# Patient Record
Sex: Female | Born: 1965 | Race: White | Hispanic: No | Marital: Single | Smoking: Current every day smoker
Health system: Southern US, Community
[De-identification: ages and names within clinical notes are randomized; demographics above are authoritative.]

## PROBLEM LIST (undated history)

## (undated) DIAGNOSIS — I10 Essential (primary) hypertension: Secondary | ICD-10-CM

## (undated) DIAGNOSIS — K219 Gastro-esophageal reflux disease without esophagitis: Secondary | ICD-10-CM

## (undated) DIAGNOSIS — J45909 Unspecified asthma, uncomplicated: Secondary | ICD-10-CM

## (undated) DIAGNOSIS — J449 Chronic obstructive pulmonary disease, unspecified: Secondary | ICD-10-CM

## (undated) DIAGNOSIS — IMO0001 Reserved for inherently not codable concepts without codable children: Secondary | ICD-10-CM

## (undated) DIAGNOSIS — K7460 Unspecified cirrhosis of liver: Secondary | ICD-10-CM

## (undated) DIAGNOSIS — R011 Cardiac murmur, unspecified: Secondary | ICD-10-CM

## (undated) HISTORY — PX: TUBAL LIGATION: SHX77

## (undated) HISTORY — PX: SHOULDER SURGERY: SHX246

## (undated) HISTORY — PX: NECK SURGERY: SHX720

## (undated) HISTORY — PX: ANKLE SURGERY: SHX546

---

## 2001-02-03 ENCOUNTER — Inpatient Hospital Stay (HOSPITAL_COMMUNITY): Admission: EM | Admit: 2001-02-03 | Discharge: 2001-02-06 | Payer: Self-pay | Admitting: Psychiatry

## 2003-06-13 ENCOUNTER — Emergency Department (HOSPITAL_COMMUNITY): Admission: EM | Admit: 2003-06-13 | Discharge: 2003-06-13 | Payer: Self-pay | Admitting: Emergency Medicine

## 2003-06-13 ENCOUNTER — Emergency Department (HOSPITAL_COMMUNITY): Admission: EM | Admit: 2003-06-13 | Discharge: 2003-06-14 | Payer: Self-pay | Admitting: Emergency Medicine

## 2003-06-14 ENCOUNTER — Encounter: Payer: Self-pay | Admitting: Emergency Medicine

## 2003-07-02 ENCOUNTER — Emergency Department (HOSPITAL_COMMUNITY): Admission: EM | Admit: 2003-07-02 | Discharge: 2003-07-02 | Payer: Self-pay | Admitting: Emergency Medicine

## 2003-07-03 ENCOUNTER — Inpatient Hospital Stay (HOSPITAL_COMMUNITY): Admission: AD | Admit: 2003-07-03 | Discharge: 2003-07-06 | Payer: Self-pay | Admitting: Psychiatry

## 2016-01-04 ENCOUNTER — Emergency Department (HOSPITAL_COMMUNITY): Payer: BLUE CROSS/BLUE SHIELD

## 2016-01-04 ENCOUNTER — Emergency Department (HOSPITAL_COMMUNITY)
Admission: EM | Admit: 2016-01-04 | Discharge: 2016-01-04 | Disposition: A | Discharge status: Home / Self Care | Payer: BLUE CROSS/BLUE SHIELD | Attending: Emergency Medicine | Admitting: Emergency Medicine

## 2016-01-04 ENCOUNTER — Encounter (HOSPITAL_COMMUNITY): Payer: Self-pay | Admitting: *Deleted

## 2016-01-04 DIAGNOSIS — J449 Chronic obstructive pulmonary disease, unspecified: Secondary | ICD-10-CM | POA: Diagnosis not present

## 2016-01-04 DIAGNOSIS — Y9289 Other specified places as the place of occurrence of the external cause: Secondary | ICD-10-CM | POA: Insufficient documentation

## 2016-01-04 DIAGNOSIS — S4992XA Unspecified injury of left shoulder and upper arm, initial encounter: Secondary | ICD-10-CM | POA: Diagnosis present

## 2016-01-04 DIAGNOSIS — Y9389 Activity, other specified: Secondary | ICD-10-CM | POA: Insufficient documentation

## 2016-01-04 DIAGNOSIS — I10 Essential (primary) hypertension: Secondary | ICD-10-CM | POA: Insufficient documentation

## 2016-01-04 DIAGNOSIS — S42212A Unspecified displaced fracture of surgical neck of left humerus, initial encounter for closed fracture: Secondary | ICD-10-CM | POA: Insufficient documentation

## 2016-01-04 DIAGNOSIS — S42302A Unspecified fracture of shaft of humerus, left arm, initial encounter for closed fracture: Secondary | ICD-10-CM

## 2016-01-04 DIAGNOSIS — F17200 Nicotine dependence, unspecified, uncomplicated: Secondary | ICD-10-CM | POA: Insufficient documentation

## 2016-01-04 DIAGNOSIS — Y998 Other external cause status: Secondary | ICD-10-CM | POA: Diagnosis not present

## 2016-01-04 DIAGNOSIS — Z79899 Other long term (current) drug therapy: Secondary | ICD-10-CM | POA: Diagnosis not present

## 2016-01-04 DIAGNOSIS — W000XXA Fall on same level due to ice and snow, initial encounter: Secondary | ICD-10-CM | POA: Diagnosis not present

## 2016-01-04 HISTORY — DX: Essential (primary) hypertension: I10

## 2016-01-04 HISTORY — DX: Unspecified asthma, uncomplicated: J45.909

## 2016-01-04 HISTORY — DX: Chronic obstructive pulmonary disease, unspecified: J44.9

## 2016-01-04 LAB — CBC WITH DIFFERENTIAL/PLATELET
Basophils Absolute: 0 10*3/uL (ref 0.0–0.1)
Basophils Relative: 0 %
EOS ABS: 0.1 10*3/uL (ref 0.0–0.7)
EOS PCT: 1 %
HCT: 39.8 % (ref 36.0–46.0)
HEMOGLOBIN: 13.1 g/dL (ref 12.0–15.0)
LYMPHS ABS: 1.8 10*3/uL (ref 0.7–4.0)
Lymphocytes Relative: 17 %
MCH: 31.8 pg (ref 26.0–34.0)
MCHC: 32.9 g/dL (ref 30.0–36.0)
MCV: 96.6 fL (ref 78.0–100.0)
MONO ABS: 1.2 10*3/uL — AB (ref 0.1–1.0)
MONOS PCT: 11 %
Neutro Abs: 7.4 10*3/uL (ref 1.7–7.7)
Neutrophils Relative %: 71 %
PLATELETS: 275 10*3/uL (ref 150–400)
RBC: 4.12 MIL/uL (ref 3.87–5.11)
RDW: 15.2 % (ref 11.5–15.5)
WBC: 10.5 10*3/uL (ref 4.0–10.5)

## 2016-01-04 LAB — BASIC METABOLIC PANEL
Anion gap: 14 (ref 5–15)
BUN: 10 mg/dL (ref 6–20)
CHLORIDE: 102 mmol/L (ref 101–111)
CO2: 23 mmol/L (ref 22–32)
CREATININE: 0.76 mg/dL (ref 0.44–1.00)
Calcium: 8.7 mg/dL — ABNORMAL LOW (ref 8.9–10.3)
GFR calc Af Amer: >60 mL/min (ref >60)
GFR calc non Af Amer: >60 mL/min (ref >60)
GLUCOSE: 108 mg/dL — AB (ref 65–99)
Potassium: 3.8 mmol/L (ref 3.5–5.1)
Sodium: 139 mmol/L (ref 135–145)

## 2016-01-04 MED ORDER — HYDROMORPHONE HCL 1 MG/ML IJ SOLN
1.0000 mg | Freq: Once | INTRAMUSCULAR | Status: AC
  Administered 2016-01-04: 1 mg via INTRAVENOUS
  Filled 2016-01-04: qty 1

## 2016-01-04 MED ORDER — OXYCODONE-ACETAMINOPHEN 5-325 MG PO TABS
1.0000 | ORAL_TABLET | Freq: Once | ORAL | Status: AC
Start: 2016-01-04 — End: 2016-01-04
  Administered 2016-01-04: 1 via ORAL
  Filled 2016-01-04: qty 1

## 2016-01-04 MED ORDER — OXYCODONE-ACETAMINOPHEN 5-325 MG PO TABS: 1.0000 | ORAL_TABLET | Freq: Four times a day (QID) | ORAL | Status: DC | PRN

## 2016-01-04 MED ORDER — FENTANYL CITRATE (PF) 100 MCG/2ML IJ SOLN
50.0000 ug | Freq: Once | INTRAMUSCULAR | Status: AC
  Administered 2016-01-04: 50 ug via INTRAVENOUS
  Filled 2016-01-04: qty 2

## 2016-01-04 MED ORDER — OXYCODONE-ACETAMINOPHEN 5-325 MG PO TABS
1.0000 | ORAL_TABLET | Freq: Once | ORAL | Status: AC
  Administered 2016-01-04: 1 via ORAL
  Filled 2016-01-04: qty 1

## 2016-01-04 MED ORDER — ONDANSETRON 4 MG PO TBDP
4.0000 mg | ORAL_TABLET | Freq: Once | ORAL | Status: AC
  Administered 2016-01-04: 4 mg via ORAL
  Filled 2016-01-04: qty 1

## 2016-01-04 NOTE — ED Notes (Signed)
Pt shirt cut off per pt request, gown placed on pt.

## 2016-01-04 NOTE — Discharge Instructions (Signed)
You were seen in the emergency room today for evaluation following a fall on ice. Your x-rays show evidence of a fracture at the head (the top) of your humerus, or your arm bone. You will need surgery by the orthopedic surgeons. I spoke to Dr. Charlann Boxerlin, the on-call surgeon today, and he would like you to go [facility] today after 1:00 PM to see one of his colleagues for consultation. They are planning to do the surgical repair later this week. In the meantime I will give you a sling for your arm and a prescription for pain medicine. You may keep ice on the area as well to help with pain and swelling.

## 2016-01-04 NOTE — ED Notes (Signed)
Pt presents via POV c/o left shoulder pain after slipping on ice, denies hitting her head.  Pt a x 4, no deformity noted, +pulse.

## 2016-01-04 NOTE — ED Provider Notes (Signed)
CSN: [id number]     Arrival date & time 01/04/16  0707 History   First MD Initiated Contact with Patient 01/04/16 [phone]     Chief Complaint  Patient presents with  . Shoulder Pain    Brenda [name] [address]  [place] [address] [phone] (M)  HPI  Brenda [name] is an 50 y.o. female w/ history of HTN and COPD who presents for evaluation of left shoulder and upper arm pain following a mechanical fall last night when Brenda [name] slipped on some ice in a parking lot. Brenda [name] states Brenda [name] slipped on some ice and fell hitting her left arm/shoulder. Brenda [name] denies hitting her head or LOC. Denies any other injury. Denies new weakness, numbness, or tingling. Brenda [name] states her left shoulder/arm is just very painful. Brenda [name] states Brenda [name] has been visiting her father her in the hospital overnight and has not been able to take anything for the pain.   Past Medical History  Diagnosis Date  . Hypertension   . COPD (chronic obstructive pulmonary disease) (HCC)   . Asthma    Past Surgical History  Procedure Laterality Date  . Tubal ligation    . Shoulder surgery Right   . Neck surgery    . Ankle surgery Left    No family history on file. Social History  Substance Use Topics  . Smoking status: Current Every Day Smoker  . Smokeless tobacco: None  . Alcohol Use: Yes     Comment: 5-6 "drinks" a week   OB History    No data available     Review of Systems  All other systems reviewed and are negative.      Allergies  Review of patient's allergies indicates no known allergies.  Home Medications   Prior to Admission medications   Medication Sig Start Date End Date Taking? Authorizing Provider  cholecalciferol (VITAMIN D) 1000 units tablet Take 1,000 Units by mouth daily.   Yes Historical Provider, MD  HYDROcodone-acetaminophen (NORCO) 7.5-325 MG tablet Take 1 tablet by mouth every 6 (six) hours as needed for moderate pain.   Yes Historical Provider, MD   montelukast (SINGULAIR) 10 MG tablet Take 10 mg by mouth at bedtime.   Yes Historical Provider, MD  omeprazole (PRILOSEC) 40 MG capsule Take 40 mg by mouth daily.   Yes Historical Provider, MD  potassium chloride 20 MEQ/15ML (10%) SOLN Take 20 mEq by mouth 2 (two) times daily.   Yes Historical Provider, MD  propranolol (INDERAL) 40 MG tablet Take 40 mg by mouth daily.   Yes Historical Provider, MD  traZODone (DESYREL) 150 MG tablet Take 150 mg by mouth at bedtime.   Yes Historical Provider, MD   BP 114/77 mmHg  Pulse 88  Temp(Src) 97.9 F (36.6 C) (Oral)  Resp 20  Ht 5\' 6"  (1.676 m)  Wt 85.73 kg  BMI 30.52 kg/m2  SpO2 95% Physical Exam  Constitutional: Brenda [name] is oriented to person, place, and time.  HENT:  Head: Atraumatic.  Right Ear: Tympanic membrane and external ear normal. No hemotympanum.  Left Ear: Tympanic membrane and external ear normal. No hemotympanum.  Nose: Nose normal.  Mouth/Throat: Oropharynx is clear and moist. No oropharyngeal exudate.  Eyes: Conjunctivae and EOM are normal. Pupils are equal, round, and reactive to light.  Neck: Normal range of motion. Neck supple. No tracheal deviation present.  Cardiovascular: Normal rate, regular rhythm, normal heart sounds and intact distal pulses.   Pulmonary/Chest: Effort normal and breath sounds  normal. No respiratory distress. Brenda [name] has no wheezes. Brenda [name] exhibits no tenderness.  Abdominal: Soft. Bowel sounds are normal. Brenda [name] exhibits no distension. There is no tenderness.  Musculoskeletal: Brenda [name] exhibits no edema.  Left shoulder and upper half of left upper arm is diffusely ttp. Brenda [name] guarding against ROM due to pain. Intact sensation. 5/5 Grip strength bilaterally. No c-spine, t-spine, or l-spine tenderness. No other visible deformity or injury.   Neurological: Brenda [name] is alert and oriented to person, place, and time. No cranial nerve deficit.  Skin: Skin is warm and dry. No abrasion, no bruising and no laceration noted.  Psychiatric: Brenda [name]  has a normal mood and affect.  Nursing note and vitals reviewed.  Filed Vitals:   01/04/16 0730 01/04/16 0804 01/04/16 0815 01/04/16 0830  BP: 125/80  119/80 114/77  Pulse: 84 88 88 88  Temp:      TempSrc:      Resp:      Height:      Weight:      SpO2: 97% 97% 95% 95%     ED Course  Procedures (including critical care time) Labs Review Labs Reviewed  BASIC METABOLIC PANEL - Abnormal; Notable for the following:    Glucose, Bld 108 (*)    Calcium 8.7 (*)    All other components within normal limits  CBC WITH DIFFERENTIAL/PLATELET - Abnormal; Notable for the following:    Monocytes Absolute 1.2 (*)    All other components within normal limits    Imaging Review Dg Shoulder Left  01/04/2016  CLINICAL DATA:  50 year old female with proximal left humerus pain after falling last night on the ice EXAM: LEFT SHOULDER - 2+ VIEW COMPARISON:  Concurrently obtained radiographs of the left humerus FINDINGS: Comminuted and displaced fracture through the surgical neck of the humerus. The humeral head remains located with respect to the glenoid. The visualized thorax is unremarkable. The acromioclavicular joint remains intact. IMPRESSION: 1. Comminuted fracture through the surgical neck of the humerus. 2. The humeral head remains located with respect to the glenoid. Electronically Signed   By: Malachy Moan M.D.   On: 01/04/2016 07:53   Dg Humerus Left  01/04/2016  CLINICAL DATA:  Fall, shoulder pain, injury EXAM: LEFT HUMERUS - 2+ VIEW COMPARISON:  01/04/2016 FINDINGS: There is an acute displaced and comminuted fracture of the left proximal humerus surgical neck. Distal fragment is displaced anteriorly. Humeral head fragment is rotated laterally. Distal aspect of the humerus appears intact. IMPRESSION: Acute displaced left proximal humerus surgical neck fracture. Electronically Signed   By: Judie Petit.  Shick M.D.   On: 01/04/2016 07:54   I have personally reviewed and evaluated these images and lab  results as part of my medical decision-making.   EKG Interpretation None      MDM   Final diagnoses:  Left humeral fracture, closed, initial encounter    Brenda [name] is an 50 y.o. female presenting for eval of left shoulder/arm pain following fall due to slipping on ice. Will obtain shoulder and humerus XR. Will give pain meds.   XR significant for displaced, comminuted fracture through humeral neck. Humeral head fragment is rotated laterally. I ordered basic labs in anticipation of possible surgery. Spoke to Dr. Charlann Boxer. Brenda [name] to be discharged from the ED with shoulder sling and pain meds, go to clinic after 1PM today for outpatient consult. Plan for surgery later this week. I spoke to Brenda [name] and Brenda [name] is in agreement with plan.    Carlene Coria, PA-C 01/04/16 [phone]  Rolan BuccoMelanie Belfi, MD 01/04/16 [phone]

## 2016-01-04 NOTE — ED Notes (Signed)
PA at bedside.

## 2016-01-05 ENCOUNTER — Encounter (HOSPITAL_COMMUNITY): Payer: Self-pay | Admitting: *Deleted

## 2016-01-05 NOTE — Progress Notes (Signed)
[facility] AT [facility] REC[facility] FOR A COPY OF ECHO, EKG, OV. [phone]

## 2016-01-06 ENCOUNTER — Encounter (HOSPITAL_COMMUNITY)
Admission: RE | Disposition: A | Discharge status: Home / Self Care | Payer: Self-pay | Source: Ambulatory Visit | Attending: Orthopedic Surgery

## 2016-01-06 ENCOUNTER — Observation Stay (HOSPITAL_COMMUNITY)
Admission: RE | Admit: 2016-01-06 | Discharge: 2016-01-08 | Disposition: A | Discharge status: Home / Self Care | Payer: BLUE CROSS/BLUE SHIELD | Source: Ambulatory Visit | Attending: Orthopedic Surgery | Admitting: Orthopedic Surgery

## 2016-01-06 ENCOUNTER — Ambulatory Visit (HOSPITAL_COMMUNITY): Payer: BLUE CROSS/BLUE SHIELD

## 2016-01-06 ENCOUNTER — Encounter (HOSPITAL_COMMUNITY): Payer: Self-pay | Admitting: *Deleted

## 2016-01-06 ENCOUNTER — Ambulatory Visit (HOSPITAL_COMMUNITY): Payer: BLUE CROSS/BLUE SHIELD | Admitting: Anesthesiology

## 2016-01-06 DIAGNOSIS — Z419 Encounter for procedure for purposes other than remedying health state, unspecified: Secondary | ICD-10-CM

## 2016-01-06 DIAGNOSIS — J45909 Unspecified asthma, uncomplicated: Secondary | ICD-10-CM | POA: Diagnosis not present

## 2016-01-06 DIAGNOSIS — J449 Chronic obstructive pulmonary disease, unspecified: Secondary | ICD-10-CM | POA: Diagnosis not present

## 2016-01-06 DIAGNOSIS — W000XXA Fall on same level due to ice and snow, initial encounter: Secondary | ICD-10-CM | POA: Diagnosis not present

## 2016-01-06 DIAGNOSIS — S42209A Unspecified fracture of upper end of unspecified humerus, initial encounter for closed fracture: Secondary | ICD-10-CM | POA: Diagnosis present

## 2016-01-06 DIAGNOSIS — F17200 Nicotine dependence, unspecified, uncomplicated: Secondary | ICD-10-CM | POA: Diagnosis not present

## 2016-01-06 DIAGNOSIS — K219 Gastro-esophageal reflux disease without esophagitis: Secondary | ICD-10-CM | POA: Insufficient documentation

## 2016-01-06 DIAGNOSIS — S42222A 2-part displaced fracture of surgical neck of left humerus, initial encounter for closed fracture: Secondary | ICD-10-CM | POA: Diagnosis present

## 2016-01-06 DIAGNOSIS — I10 Essential (primary) hypertension: Secondary | ICD-10-CM | POA: Insufficient documentation

## 2016-01-06 DIAGNOSIS — Z79899 Other long term (current) drug therapy: Secondary | ICD-10-CM | POA: Diagnosis not present

## 2016-01-06 HISTORY — DX: Gastro-esophageal reflux disease without esophagitis: K21.9

## 2016-01-06 HISTORY — PX: ORIF HUMERUS FRACTURE: SHX2126

## 2016-01-06 LAB — COMPREHENSIVE METABOLIC PANEL
ALBUMIN: 3.4 g/dL — AB (ref 3.5–5.0)
ALT: 16 U/L (ref 14–54)
ANION GAP: 8 (ref 5–15)
AST: 17 U/L (ref 15–41)
Alkaline Phosphatase: 70 U/L (ref 38–126)
BILIRUBIN TOTAL: 1.4 mg/dL — AB (ref 0.3–1.2)
BUN: 8 mg/dL (ref 6–20)
CHLORIDE: 101 mmol/L (ref 101–111)
CO2: 27 mmol/L (ref 22–32)
CREATININE: 0.8 mg/dL (ref 0.44–1.00)
Calcium: 8.7 mg/dL — ABNORMAL LOW (ref 8.9–10.3)
GFR calc non Af Amer: >60 mL/min (ref >60)
Glucose, Bld: 109 mg/dL — ABNORMAL HIGH (ref 65–99)
Potassium: 3.6 mmol/L (ref 3.5–5.1)
SODIUM: 136 mmol/L (ref 135–145)
Total Protein: 6.5 g/dL (ref 6.5–8.1)

## 2016-01-06 LAB — CBC WITH DIFFERENTIAL/PLATELET
BASOS PCT: 0 %
Basophils Absolute: 0 10*3/uL (ref 0.0–0.1)
EOS ABS: 0.1 10*3/uL (ref 0.0–0.7)
Eosinophils Relative: 1 %
HEMATOCRIT: 36.2 % (ref 36.0–46.0)
Hemoglobin: 12.4 g/dL (ref 12.0–15.0)
Lymphocytes Relative: 20 %
Lymphs Abs: 2.1 10*3/uL (ref 0.7–4.0)
MCH: 33.2 pg (ref 26.0–34.0)
MCHC: 34.3 g/dL (ref 30.0–36.0)
MCV: 96.8 fL (ref 78.0–100.0)
MONO ABS: 1.4 10*3/uL — AB (ref 0.1–1.0)
MONOS PCT: 13 %
Neutro Abs: 6.7 10*3/uL (ref 1.7–7.7)
Neutrophils Relative %: 66 %
Platelets: 264 10*3/uL (ref 150–400)
RBC: 3.74 MIL/uL — ABNORMAL LOW (ref 3.87–5.11)
RDW: 14.8 % (ref 11.5–15.5)
WBC: 10.3 10*3/uL (ref 4.0–10.5)

## 2016-01-06 LAB — PROTIME-INR
INR: 0.96 (ref 0.00–1.49)
Prothrombin Time: 13 seconds (ref 11.6–15.2)

## 2016-01-06 LAB — APTT: APTT: 33 s (ref 24–37)

## 2016-01-06 SURGERY — OPEN REDUCTION INTERNAL FIXATION (ORIF) PROXIMAL HUMERUS FRACTURE
Anesthesia: Regional | Laterality: Left

## 2016-01-06 MED ORDER — PROPOFOL 10 MG/ML IV BOLUS
INTRAVENOUS | Status: DC | PRN
  Administered 2016-01-06: 150 mg via INTRAVENOUS

## 2016-01-06 MED ORDER — DIPHENHYDRAMINE HCL 12.5 MG/5ML PO ELIX: 12.5000 mg | ORAL_SOLUTION | ORAL | Status: DC | PRN

## 2016-01-06 MED ORDER — FENTANYL CITRATE (PF) 250 MCG/5ML IJ SOLN
INTRAMUSCULAR | Status: DC | PRN
  Administered 2016-01-06: 50 ug via INTRAVENOUS

## 2016-01-06 MED ORDER — HYDROMORPHONE HCL 1 MG/ML IJ SOLN
0.2500 mg | INTRAMUSCULAR | Status: DC | PRN
  Administered 2016-01-06 (×2): 0.5 mg via INTRAVENOUS

## 2016-01-06 MED ORDER — MAGNESIUM CITRATE PO SOLN: 1.0000 | Freq: Once | ORAL | Status: DC | PRN

## 2016-01-06 MED ORDER — BISACODYL 5 MG PO TBEC: 5.0000 mg | DELAYED_RELEASE_TABLET | Freq: Every day | ORAL | Status: DC | PRN

## 2016-01-06 MED ORDER — POTASSIUM CHLORIDE 20 MEQ/15ML (10%) PO SOLN
20.0000 meq | Freq: Two times a day (BID) | ORAL | Status: DC
  Administered 2016-01-06 – 2016-01-08 (×4): 20 meq via ORAL
  Filled 2016-01-06 (×6): qty 15

## 2016-01-06 MED ORDER — ACETAMINOPHEN 325 MG PO TABS: 650.0000 mg | ORAL_TABLET | Freq: Four times a day (QID) | ORAL | Status: DC | PRN

## 2016-01-06 MED ORDER — METOCLOPRAMIDE HCL 5 MG/ML IJ SOLN: 5.0000 mg | Freq: Three times a day (TID) | INTRAMUSCULAR | Status: DC | PRN

## 2016-01-06 MED ORDER — NICOTINE 21 MG/24HR TD PT24
21.0000 mg | MEDICATED_PATCH | Freq: Every day | TRANSDERMAL | Status: DC
  Administered 2016-01-06 – 2016-01-08 (×3): 21 mg via TRANSDERMAL
  Filled 2016-01-06 (×3): qty 1

## 2016-01-06 MED ORDER — CEFAZOLIN SODIUM-DEXTROSE 2-3 GM-% IV SOLR
INTRAVENOUS | Status: AC
  Filled 2016-01-06: qty 50

## 2016-01-06 MED ORDER — PROPOFOL 10 MG/ML IV BOLUS
INTRAVENOUS | Status: AC
  Filled 2016-01-06: qty 20

## 2016-01-06 MED ORDER — CEFAZOLIN SODIUM-DEXTROSE 2-3 GM-% IV SOLR
2.0000 g | Freq: Four times a day (QID) | INTRAVENOUS | Status: AC
  Administered 2016-01-07 (×2): 2 g via INTRAVENOUS
  Filled 2016-01-06 (×3): qty 50

## 2016-01-06 MED ORDER — FENTANYL CITRATE (PF) 250 MCG/5ML IJ SOLN
INTRAMUSCULAR | Status: AC
  Filled 2016-01-06: qty 5

## 2016-01-06 MED ORDER — LIDOCAINE HCL (CARDIAC) 20 MG/ML IV SOLN
INTRAVENOUS | Status: AC
  Filled 2016-01-06: qty 5

## 2016-01-06 MED ORDER — ROCURONIUM BROMIDE 50 MG/5ML IV SOLN
INTRAVENOUS | Status: AC
  Filled 2016-01-06: qty 1

## 2016-01-06 MED ORDER — 0.9 % SODIUM CHLORIDE (POUR BTL) OPTIME
TOPICAL | Status: DC | PRN
  Administered 2016-01-06: 1000 mL

## 2016-01-06 MED ORDER — OXYCODONE HCL 5 MG/5ML PO SOLN: 5.0000 mg | Freq: Once | ORAL | Status: AC | PRN

## 2016-01-06 MED ORDER — FENTANYL CITRATE (PF) 100 MCG/2ML IJ SOLN
INTRAMUSCULAR | Status: AC
  Administered 2016-01-06: 50 ug via INTRAVENOUS
  Filled 2016-01-06: qty 2

## 2016-01-06 MED ORDER — OXYCODONE HCL 5 MG PO TABS
5.0000 mg | ORAL_TABLET | ORAL | Status: DC | PRN
  Administered 2016-01-06: 10 mg via ORAL
  Administered 2016-01-06: 5 mg via ORAL
  Administered 2016-01-07 – 2016-01-08 (×9): 10 mg via ORAL
  Filled 2016-01-06 (×3): qty 2
  Filled 2016-01-06: qty 1
  Filled 2016-01-06 (×8): qty 2

## 2016-01-06 MED ORDER — MIDAZOLAM HCL 2 MG/2ML IJ SOLN
INTRAMUSCULAR | Status: AC
  Administered 2016-01-06: 2 mg
  Filled 2016-01-06: qty 2

## 2016-01-06 MED ORDER — FENTANYL CITRATE (PF) 100 MCG/2ML IJ SOLN
50.0000 ug | Freq: Once | INTRAMUSCULAR | Status: AC
Start: 2016-01-06 — End: 2016-01-06
  Administered 2016-01-06: 50 ug via INTRAVENOUS

## 2016-01-06 MED ORDER — ONDANSETRON HCL 4 MG/2ML IJ SOLN
INTRAMUSCULAR | Status: AC
  Filled 2016-01-06: qty 2

## 2016-01-06 MED ORDER — LACTATED RINGERS IV SOLN
INTRAVENOUS | Status: DC
  Administered 2016-01-06: 11:00:00 via INTRAVENOUS

## 2016-01-06 MED ORDER — PHENOL 1.4 % MT LIQD: 1.0000 | OROMUCOSAL | Status: DC | PRN

## 2016-01-06 MED ORDER — ROCURONIUM BROMIDE 100 MG/10ML IV SOLN
INTRAVENOUS | Status: DC | PRN
  Administered 2016-01-06: 40 mg via INTRAVENOUS

## 2016-01-06 MED ORDER — BUPIVACAINE-EPINEPHRINE (PF) 0.5% -1:200000 IJ SOLN
INTRAMUSCULAR | Status: DC | PRN
  Administered 2016-01-06: 25 mL via PERINEURAL

## 2016-01-06 MED ORDER — MIDAZOLAM HCL 2 MG/2ML IJ SOLN: 2.0000 mg | Freq: Once | INTRAMUSCULAR | Status: DC

## 2016-01-06 MED ORDER — NEOSTIGMINE METHYLSULFATE 10 MG/10ML IV SOLN
INTRAVENOUS | Status: DC | PRN
  Administered 2016-01-06: 3 mg via INTRAVENOUS

## 2016-01-06 MED ORDER — OXYCODONE HCL 5 MG PO TABS
ORAL_TABLET | ORAL | Status: AC
  Filled 2016-01-06: qty 1

## 2016-01-06 MED ORDER — ONDANSETRON HCL 4 MG PO TABS: 4.0000 mg | ORAL_TABLET | Freq: Four times a day (QID) | ORAL | Status: DC | PRN

## 2016-01-06 MED ORDER — HYDROMORPHONE HCL 1 MG/ML IJ SOLN
1.0000 mg | INTRAMUSCULAR | Status: DC | PRN
  Administered 2016-01-06 – 2016-01-08 (×9): 1 mg via INTRAVENOUS
  Filled 2016-01-06 (×9): qty 1

## 2016-01-06 MED ORDER — OXYCODONE HCL 5 MG PO TABS
5.0000 mg | ORAL_TABLET | Freq: Once | ORAL | Status: AC | PRN
  Administered 2016-01-06: 5 mg via ORAL

## 2016-01-06 MED ORDER — PROPRANOLOL HCL 40 MG PO TABS
40.0000 mg | ORAL_TABLET | Freq: Every day | ORAL | Status: DC
  Administered 2016-01-07: 40 mg via ORAL
  Filled 2016-01-06 (×2): qty 1

## 2016-01-06 MED ORDER — DOCUSATE SODIUM 100 MG PO CAPS
100.0000 mg | ORAL_CAPSULE | Freq: Two times a day (BID) | ORAL | Status: DC
  Administered 2016-01-06 – 2016-01-08 (×4): 100 mg via ORAL
  Filled 2016-01-06 (×4): qty 1

## 2016-01-06 MED ORDER — PROMETHAZINE HCL 25 MG/ML IJ SOLN: 6.2500 mg | INTRAMUSCULAR | Status: DC | PRN

## 2016-01-06 MED ORDER — METHOCARBAMOL 500 MG PO TABS
500.0000 mg | ORAL_TABLET | Freq: Four times a day (QID) | ORAL | Status: DC | PRN
  Administered 2016-01-06 – 2016-01-07 (×2): 500 mg via ORAL
  Filled 2016-01-06 (×3): qty 1

## 2016-01-06 MED ORDER — ONDANSETRON HCL 4 MG/2ML IJ SOLN
INTRAMUSCULAR | Status: DC | PRN
  Administered 2016-01-06: 4 mg via INTRAVENOUS

## 2016-01-06 MED ORDER — CHLORHEXIDINE GLUCONATE 4 % EX LIQD: 60.0000 mL | Freq: Once | CUTANEOUS | Status: DC

## 2016-01-06 MED ORDER — OXYCODONE-ACETAMINOPHEN 5-325 MG PO TABS: 1.0000 | ORAL_TABLET | ORAL | Status: DC | PRN

## 2016-01-06 MED ORDER — TRAZODONE HCL 50 MG PO TABS
150.0000 mg | ORAL_TABLET | Freq: Every day | ORAL | Status: DC
  Administered 2016-01-06 – 2016-01-07 (×2): 150 mg via ORAL
  Filled 2016-01-06 (×2): qty 1

## 2016-01-06 MED ORDER — MONTELUKAST SODIUM 10 MG PO TABS
10.0000 mg | ORAL_TABLET | Freq: Every day | ORAL | Status: DC
  Administered 2016-01-06 – 2016-01-07 (×2): 10 mg via ORAL
  Filled 2016-01-06 (×2): qty 1

## 2016-01-06 MED ORDER — ONDANSETRON HCL 4 MG PO TABS: 4.0000 mg | ORAL_TABLET | Freq: Three times a day (TID) | ORAL | Status: AC | PRN

## 2016-01-06 MED ORDER — PHENYLEPHRINE HCL 10 MG/ML IJ SOLN
INTRAMUSCULAR | Status: DC | PRN
  Administered 2016-01-06 (×2): 80 ug via INTRAVENOUS

## 2016-01-06 MED ORDER — METHOCARBAMOL 500 MG PO TABS: 500.0000 mg | ORAL_TABLET | Freq: Three times a day (TID) | ORAL | Status: AC | PRN

## 2016-01-06 MED ORDER — ACETAMINOPHEN 650 MG RE SUPP: 650.0000 mg | Freq: Four times a day (QID) | RECTAL | Status: DC | PRN

## 2016-01-06 MED ORDER — GLYCOPYRROLATE 0.2 MG/ML IJ SOLN
INTRAMUSCULAR | Status: DC | PRN
Start: 2016-01-06 — End: 2016-01-06
  Administered 2016-01-06: 0.4 mg via INTRAVENOUS

## 2016-01-06 MED ORDER — MENTHOL 3 MG MT LOZG: 1.0000 | LOZENGE | OROMUCOSAL | Status: DC | PRN

## 2016-01-06 MED ORDER — GLYCOPYRROLATE 0.2 MG/ML IJ SOLN
INTRAMUSCULAR | Status: AC
  Filled 2016-01-06: qty 2

## 2016-01-06 MED ORDER — LIDOCAINE HCL (CARDIAC) 20 MG/ML IV SOLN
INTRAVENOUS | Status: DC | PRN
  Administered 2016-01-06: 20 mg via INTRAVENOUS

## 2016-01-06 MED ORDER — METHOCARBAMOL 1000 MG/10ML IJ SOLN
500.0000 mg | Freq: Four times a day (QID) | INTRAVENOUS | Status: DC | PRN
  Filled 2016-01-06: qty 5

## 2016-01-06 MED ORDER — METOCLOPRAMIDE HCL 5 MG PO TABS: 5.0000 mg | ORAL_TABLET | Freq: Three times a day (TID) | ORAL | Status: DC | PRN

## 2016-01-06 MED ORDER — POLYETHYLENE GLYCOL 3350 17 G PO PACK: 17.0000 g | PACK | Freq: Every day | ORAL | Status: DC | PRN

## 2016-01-06 MED ORDER — HYDROMORPHONE HCL 1 MG/ML IJ SOLN
INTRAMUSCULAR | Status: AC
  Filled 2016-01-06: qty 1

## 2016-01-06 MED ORDER — PHENYLEPHRINE HCL 10 MG/ML IJ SOLN
10.0000 mg | INTRAMUSCULAR | Status: DC | PRN
  Administered 2016-01-06: 50 ug/min via INTRAVENOUS

## 2016-01-06 MED ORDER — LACTATED RINGERS IV SOLN
INTRAVENOUS | Status: DC
  Administered 2016-01-06: 23:00:00 via INTRAVENOUS

## 2016-01-06 MED ORDER — CEFAZOLIN SODIUM-DEXTROSE 2-3 GM-% IV SOLR
2.0000 g | INTRAVENOUS | Status: AC
  Administered 2016-01-06: 2 g via INTRAVENOUS

## 2016-01-06 MED ORDER — PANTOPRAZOLE SODIUM 40 MG PO TBEC
40.0000 mg | DELAYED_RELEASE_TABLET | Freq: Every day | ORAL | Status: DC
Start: 2016-01-06 — End: 2016-01-08
  Administered 2016-01-06 – 2016-01-07 (×2): 40 mg via ORAL
  Filled 2016-01-06 (×2): qty 1

## 2016-01-06 MED ORDER — ONDANSETRON HCL 4 MG/2ML IJ SOLN
4.0000 mg | Freq: Four times a day (QID) | INTRAMUSCULAR | Status: DC | PRN
Start: 2016-01-06 — End: 2016-01-08

## 2016-01-06 SURGICAL SUPPLY — 77 items
ADH SKN CLS APL DERMABOND .7 (GAUZE/BANDAGES/DRESSINGS) ×1
AID PSTN UNV HD RSTRNT DISP (MISCELLANEOUS) ×1
BIT DRILL 3.2 (BIT) ×3
BIT DRILL 3.2XCALB NS DISP (BIT) IMPLANT
BIT DRILL CALIBRATED 2.7 (BIT) ×2 IMPLANT
BIT DRILL CALIBRATED 2.7MM (BIT) ×2
BIT DRL 3.2XCALB NS DISP (BIT) ×1
CLOSURE WOUND 1/2 X4 (GAUZE/BANDAGES/DRESSINGS) ×1
COVER SURGICAL LIGHT HANDLE (MISCELLANEOUS) ×3 IMPLANT
DERMABOND ADVANCED (GAUZE/BANDAGES/DRESSINGS) ×2
DERMABOND ADVANCED .7 DNX12 (GAUZE/BANDAGES/DRESSINGS) IMPLANT
DRAPE C-ARM 42X72 X-RAY (DRAPES) ×3 IMPLANT
DRAPE IMP U-DRAPE 54X76 (DRAPES) ×3 IMPLANT
DRAPE INCISE IOBAN 66X45 STRL (DRAPES) ×3 IMPLANT
DRAPE ORTHO SPLIT 77X108 STRL (DRAPES) ×6
DRAPE PROXIMA HALF (DRAPES) ×2 IMPLANT
DRAPE SURG ORHT 6 SPLT 77X108 (DRAPES) ×2 IMPLANT
DRAPE U-SHAPE 47X51 STRL (DRAPES) ×3 IMPLANT
DRSG AQUACEL AG ADV 3.5X10 (GAUZE/BANDAGES/DRESSINGS) ×3 IMPLANT
DRSG MEPILEX BORDER 4X8 (GAUZE/BANDAGES/DRESSINGS) ×3 IMPLANT
DURAPREP 26ML APPLICATOR (WOUND CARE) ×3 IMPLANT
ELECT BLADE 4.0 EZ CLEAN MEGAD (MISCELLANEOUS) ×3
ELECT REM PT RETURN 9FT ADLT (ELECTROSURGICAL) ×3
ELECTRODE BLDE 4.0 EZ CLN MEGD (MISCELLANEOUS) IMPLANT
ELECTRODE REM PT RTRN 9FT ADLT (ELECTROSURGICAL) ×1 IMPLANT
GLOVE BIO SURGEON STRL SZ7.5 (GLOVE) ×5 IMPLANT
GLOVE BIO SURGEON STRL SZ8 (GLOVE) ×5 IMPLANT
GLOVE BIOGEL PI IND STRL 6 (GLOVE) IMPLANT
GLOVE BIOGEL PI INDICATOR 6 (GLOVE) ×2
GLOVE EUDERMIC 7 POWDERFREE (GLOVE) ×6 IMPLANT
GLOVE SS BIOGEL STRL SZ 7.5 (GLOVE) ×2 IMPLANT
GLOVE SUPERSENSE BIOGEL SZ 7.5 (GLOVE) ×6
GLOVE SURG SS PI 6.5 STRL IVOR (GLOVE) ×2 IMPLANT
GOWN STRL REUS W/ TWL LRG LVL3 (GOWN DISPOSABLE) ×1 IMPLANT
GOWN STRL REUS W/ TWL XL LVL3 (GOWN DISPOSABLE) ×2 IMPLANT
GOWN STRL REUS W/TWL LRG LVL3 (GOWN DISPOSABLE) ×3
GOWN STRL REUS W/TWL XL LVL3 (GOWN DISPOSABLE) ×6
K-WIRE 2X5 SS THRDED S3 (WIRE) ×6
KIT BASIN OR (CUSTOM PROCEDURE TRAY) ×3 IMPLANT
KIT ROOM TURNOVER OR (KITS) ×6 IMPLANT
KWIRE 2X5 SS THRDED S3 (WIRE) IMPLANT
MANIFOLD NEPTUNE II (INSTRUMENTS) ×3 IMPLANT
NDL SUT .5 MAYO 1.404X.05X (NEEDLE) IMPLANT
NEEDLE 22X1 1/2 (OR ONLY) (NEEDLE) ×3 IMPLANT
NEEDLE MAYO TAPER (NEEDLE)
NS IRRIG 1000ML POUR BTL (IV SOLUTION) ×3 IMPLANT
PACK SHOULDER (CUSTOM PROCEDURE TRAY) ×3 IMPLANT
PAD ARMBOARD 7.5X6 YLW CONV (MISCELLANEOUS) ×6 IMPLANT
PEG LOCKING 3.2MMX26MM (Peg) ×2 IMPLANT
PEG LOCKING 3.2X 28MM (Peg) ×4 IMPLANT
PEG LOCKING 3.2X32 (Peg) ×8 IMPLANT
PEG LOCKING 3.2X38 (Screw) ×2 IMPLANT
PEG LOCKING 3.2X42 (Screw) ×2 IMPLANT
PEG LOCKING 3.2X48 (Peg) ×2 IMPLANT
PLATE PROX HUM HI L 3H 80 (Plate) ×2 IMPLANT
RESTRAINT HEAD UNIVERSAL NS (MISCELLANEOUS) ×3 IMPLANT
SCREW LOW PROF TIS 3.5X28MM (Screw) ×2 IMPLANT
SCREW LP NL T15 3.5X26 (Screw) ×4 IMPLANT
SLEEVE MEASURING 3.2 (BIT) ×2 IMPLANT
SLING ARM IMMOBILIZER LRG (SOFTGOODS) ×2 IMPLANT
SLING ULTRA II LARGE (SOFTGOODS) ×3 IMPLANT
SPONGE LAP 18X18 X RAY DECT (DISPOSABLE) ×3 IMPLANT
STRIP CLOSURE SKIN 1/2X4 (GAUZE/BANDAGES/DRESSINGS) ×1 IMPLANT
SUCTION FRAZIER TIP 10 FR DISP (SUCTIONS) ×3 IMPLANT
SUT FIBERWIRE #2 38 T-5 BLUE (SUTURE)
SUT MNCRL AB 3-0 PS2 18 (SUTURE) ×5 IMPLANT
SUT MON AB 2-0 CT1 36 (SUTURE) ×2 IMPLANT
SUT VIC AB 1 CT1 27 (SUTURE) ×6
SUT VIC AB 1 CT1 27XBRD ANBCTR (SUTURE) IMPLANT
SUT VIC AB 1 CT1 27XBRD ANTBC (SUTURE) ×1 IMPLANT
SUT VIC AB 2-0 CT1 27 (SUTURE) ×6
SUT VIC AB 2-0 CT1 TAPERPNT 27 (SUTURE) ×2 IMPLANT
SUTURE FIBERWR #2 38 T-5 BLUE (SUTURE) IMPLANT
SYR CONTROL 10ML LL (SYRINGE) ×3 IMPLANT
TOWEL OR 17X24 6PK STRL BLUE (TOWEL DISPOSABLE) ×3 IMPLANT
TOWEL OR 17X26 10 PK STRL BLUE (TOWEL DISPOSABLE) ×3 IMPLANT
WATER STERILE IRR 1000ML POUR (IV SOLUTION) ×3 IMPLANT

## 2016-01-06 NOTE — Anesthesia Preprocedure Evaluation (Addendum)
Anesthesia Evaluation  Patient identified by MRN, date of birth, ID band Patient awake    Reviewed: Allergy & Precautions, NPO status , Patient's Chart, lab work & pertinent test results, reviewed documented beta blocker date and time   Airway Mallampati: II  TM Distance: >3 FB Neck ROM: Full    Dental  (+) Loose, Dental Advisory Given   Pulmonary asthma , COPD, Current Smoker,    breath sounds clear to auscultation       Cardiovascular hypertension, Pt. on medications and Pt. on home beta blockers  Rhythm:Regular Rate:Normal     Neuro/Psych negative neurological ROS     GI/Hepatic Neg liver ROS, GERD  ,  Endo/Other  negative endocrine ROS  Renal/GU negative Renal ROS     Musculoskeletal   Abdominal   Peds  Hematology negative hematology ROS (+)   Anesthesia Other Findings   Reproductive/Obstetrics                           Lab Results  Component Value Date   WBC 10.3 01/06/2016   HGB 12.4 01/06/2016   HCT 36.2 01/06/2016   MCV 96.8 01/06/2016   PLT 264 01/06/2016   Lab Results  Component Value Date   CREATININE 0.76 01/04/2016   BUN 10 01/04/2016   NA 139 01/04/2016   K 3.8 01/04/2016   CL 102 01/04/2016   CO2 23 01/04/2016    Anesthesia Physical Anesthesia Plan  ASA: II  Anesthesia Plan: General and Regional   Post-op Pain Management: GA combined w/ Regional for post-op pain   Induction: Intravenous  Airway Management Planned: Oral ETT  Additional Equipment:   Intra-op Plan:   Post-operative Plan: Extubation in OR  Informed Consent: I have reviewed the patients History and Physical, chart, labs and discussed the procedure including the risks, benefits and alternatives for the proposed anesthesia with the patient or authorized representative who has indicated his/her understanding and acceptance.   Dental advisory given  Plan Discussed with: CRNA  Anesthesia  Plan Comments:         Anesthesia Quick Evaluation

## 2016-01-06 NOTE — Anesthesia Procedure Notes (Addendum)
Anesthesia Regional Block:  Interscalene brachial plexus block  Pre-Anesthetic Checklist: ,, timeout performed, Correct Patient, Correct Site, Correct Laterality, Correct Procedure, Correct Position, site marked, Risks and benefits discussed,  Surgical consent,  Pre-op evaluation,  At surgeon's request and post-op pain management  Laterality: Left  Prep: chloraprep       Needles:  Injection technique: Single-shot  Needle Type: Echogenic Stimulator Needle     Needle Length: 9cm 9 cm Needle Gauge: 21 and 21 G    Additional Needles:  Procedures: ultrasound guided (picture in chart) and nerve stimulator Interscalene brachial plexus block  Nerve Stimulator or Paresthesia:  Response: deltoid, 0.5 mA,   Additional Responses:   Narrative:  Start time: 01/06/2016 11:00 AM End time: 01/06/2016 11:10 AM Injection made incrementally with aspirations every 5 mL.  Performed by: Personally  Anesthesiologist: Marcene DuosFITZGERALD, ROBERT  Additional Notes: Risks and benefits discussed. Pt tolerated well with no immediate complications.   Procedure Name: Intubation Date/Time: 01/06/2016 12:31 PM Performed by: Sharlene DoryWALKER, Sanjiv Castorena E Pre-anesthesia Checklist: Patient identified, Emergency Drugs available, Suction available, Patient being monitored and Timeout performed Patient Re-evaluated:Patient Re-evaluated prior to inductionOxygen Delivery Method: Circle system utilized Preoxygenation: Pre-oxygenation with 100% oxygen Intubation Type: IV induction Ventilation: Mask ventilation without difficulty Laryngoscope Size: Mac and 3 Grade View: Grade II Tube type: Oral Tube size: 7.0 mm Number of attempts: 1 Airway Equipment and Method: Stylet Placement Confirmation: ETT inserted through vocal cords under direct vision,  positive ETCO2 and breath sounds checked- equal and bilateral Secured at: 21 cm Tube secured with: Tape Dental Injury: Teeth and Oropharynx as per pre-operative assessment

## 2016-01-06 NOTE — Discharge Instructions (Signed)
° °  Vania ReaKevin M. Supple, M.D., F.A.A.O.S. Orthopaedic Surgery Specializing in Arthroscopic and Reconstructive Surgery of the Shoulder and Knee [phone] [address] [place], [place] [address] - Fax [phone]   POST-OP TOTAL SHOULDER REPLACEMENT/SHOULDER HEMIARTHROPLASTY INSTRUCTIONS  1. Call the office at [phone] to schedule your first post-op appointment 10-14 days from the date of your surgery.  2. The bandage over your incision is waterproof. You may begin showering with this dressing on. You may leave this dressing on until first follow up appointment within 2 weeks. We prefer you leave this dressing in place until follow up however after 5-7 days if you are having itching or skin irritation and would like to remove it you may do so. Go slow and tug at the borders gently to break the bond the dressing has with the skin. At this point if there is no drainage it is okay to go without a bandage or you may cover it with a light guaze and tape. You can also expect significant bruising around your shoulder that will drift down your arm and into your chest wall. This is very normal and should resolve over several days.   3. Wear your sling/immobilizer at all times except to perform the exercises below or to occasionally let your arm dangle by your side to stretch your elbow. You also need to sleep in your sling immobilizer until instructed otherwise.  4. Range of motion to your elbow, wrist, and hand are encouraged 3-5 times daily. Exercise to your hand and fingers helps to reduce swelling you may experience.  5. Utilize ice to the shoulder 3-5 times minimum a day and additionally if you are experiencing pain.  6. Prescriptions for a pain medication and a muscle relaxant are provided for you. It is recommended that if you are experiencing pain that you pain medication alone is not controlling, add the muscle relaxant along with the pain medication which can give additional pain  relief. The first 1-2 days is generally the most severe of your pain and then should gradually decrease. As your pain lessens it is recommended that you decrease your use of the pain medications to an "as needed basis'" only and to always comply with the recommended dosages of the pain medications.  7. Pain medications can produce constipation along with their use. If you experience this, the use of an over the counter stool softener or laxative daily is recommended.   8. For most patients, if insurance allows, home health services to include therapy has been arranged.  9. For additional questions or concerns, please do not hesitate to call the office. If after hours there is an answering service to forward your concerns to the physician on call.  POST-OP EXERCISES  May allow arm to dangle to move elbow wrist and hand

## 2016-01-06 NOTE — Transfer of Care (Signed)
Immediate Anesthesia Transfer of Care Note  Patient: Brenda [name]  Procedure(s) Performed: Procedure(s): OPEN REDUCTION INTERNAL FIXATION (ORIF) PROXIMAL HUMERUS FRACTURE (Left)  Patient Location: PACU  Anesthesia Type:GA combined with regional for post-op pain  Level of Consciousness: awake, alert  and oriented  Airway & Oxygen Therapy: Patient Spontanous Breathing and Patient connected to nasal cannula oxygen  Post-op Assessment: Report given to RN, Post -op Vital signs reviewed and stable and Patient moving all extremities  Post vital signs: Reviewed and stable  Last Vitals:  Filed Vitals:   01/06/16 1121 01/06/16 1132  BP: 99/50 101/50  Pulse: 87 90  Temp:    Resp: 21 20    Complications: No apparent anesthesia complications

## 2016-01-06 NOTE — Op Note (Signed)
[name]:  [name], Brenda           ACCOUNT NO.:  [id number]  MEDICAL RECORD NO.:  [id number]  LOCATION:  5N02C                        FACILITY:  [facility]  PHYSICIAN:  Vania ReaKevin M. Yaniah Thiemann, M.D.  DATE OF BIRTH:  1966-01-22  DATE OF PROCEDURE:  01/06/2016 DATE OF DISCHARGE:                              OPERATIVE REPORT   PREOPERATIVE DIAGNOSIS:  A comminuted and severely displaced left 2-part proximal humerus fracture.  POSTOPERATIVE DIAGNOSIS:  A comminuted and severely displaced left 2- part proximal humerus fracture.  PROCEDURE:  Open reduction and internal fixation of comminuted, severely displaced left 2-part proximal humerus fracture.  SURGEON:  Vania ReaKevin M. Lliam Hoh, M.D.  Threasa HeadsASSISTANFrench Ana:  Tracy A. Shuford, P.A.-C.  ANESTHESIA:  General endotracheal as well as an interscalene block.  ESTIMATED BLOOD LOSS:  Less than 100 mL.  DRAINS:  None.  HISTORY:  Brenda [name] is a 50 year old female, who slipped and fell on the ice earlier this week, landing on the left upper extremity, sustaining immediate severe pain to the left shoulder with swelling and inability to elevate the arm.  She was initially evaluated in a local emergency room, where x-rays obtained.  She was referred to our office for followup.  She has found diffuse swelling and ecchymosis about the left shoulder, inability to elevate the arm, severe guarding, but was grossly, neurovascularly intact and left upper extremity.  Her plain radiographs reviewed, which showed a comminuted and markedly displaced 2- part proximal humerus fracture with comminution, extending in both of the tuberosities, although overall did appear to be a 2 part fracture pattern.  Due to the degree of displacement and comminution, she is brought to the operating room at this time for planned open reduction and internal fixation.  Preoperatively, I counseled Brenda [name] regarding treatment options and potential risks versus benefits thereof.  Possible  surgical complications were reviewed including bleeding, infection, neurovascular injury, malunion, nonunion, loss of fixation, posttraumatic arthritis, failure of fixation, anesthetic complication, and possible need for additional surgery.  She understands and accepts and agrees with our planned procedure.  PROCEDURE IN DETAIL:  After undergoing routine preop evaluation, the patient received prophylactic antibiotics.  An interscalene block was established in the holding area by the Anesthesia Department.  Placed supine on the operating table, underwent smooth induction of a general endotracheal anesthesia.  Placed in the beach-chair position and appropriately padded and protected.  We obtained fluoroscopic imaging at this point, to confirm that we could properly visualize the shoulder in orthogonal views.  The left shoulder girdle region was then sterilely prepped and draped in standard fashion.  Time-out was called.  An anterior deltopectoral approach was made through a 10 cm incision about the left shoulder.  Skin flaps were elevated.  Electrocautery was used for hemostasis.  Dissection carried deeply with cephalic vein identified in the deltopectoral intervals and then developed from proximal to distal, with the vein taken laterally, the upper centimeter and a half pec major was tenotomized to enhance exposure.  We then readily identified the fracture site, which the distal segment had impaled itself on the undersurface of the pectoralis major on the coracoid and the conjoined tendon.  This carefully disengaged and we obtained a provisional reduction, bringing the  shaft beneath the comminuted head. Once we had gained overall improved alignment, we utilized fluoroscopic imaging to fine tune the positioning and then selected the Biomet 3-hole high-riding plate and provisionally placed this on the lateral cortex of the proximal humerus.  Placed a guide pin up into the "center"  position of the humeral head.  Some adjustments in the positioning were performed and ultimately we obtained a good overall position of the pin within the center of the humeral head, on AP and orthogonal views.  We then placed 2 pegs up into the humeral head.  Then, transfixed the plate to the shaft and once this was completed, we were satisfied with the overall alignment.  We went ahead and completed fixation of the humeral head with a series of nonthreaded pegs and completed fixation to the shaft with a total of 3 nonlocking screws.  We then assessed the reduction and overall alignment was good considering the degree of comminution and felt that the construct was stable.  All hardware was in good position. The wound was then copiously irrigated.  Hemostasis was obtained.  The deltopectoral interval was then reapproximated with a series of figure- of-eight #1 Vicryl sutures. 2-0 Vicryl used for the subcu layer, intracuticular Monocryl for the skin, followed by Steri-Strips and a dry dressing.  Left arm was placed in sling and the patient was awakened, extubated, and taken to recovery room in stable condition.  Ralene Bathe, PAC was used as an Geophysicist/field seismologist throughout this case, essential for help with positioning of the patient, positioning of the extremity, management of the retractors, manipulation and holding of the reduction, wound closure, and intraoperative decision making.     Vania Rea. Wyatt Galvan, M.D.     KMS/MEDQ  D:  01/06/2016  T:  01/06/2016  Job:  [id number]

## 2016-01-06 NOTE — Op Note (Signed)
01/06/2016  2:05 PM  PATIENT:   Brenda [name]  50 y.o. female  PRE-OPERATIVE DIAGNOSIS:  SEVERELY DISPLACED, COMMINUTED LEFT 2 PART  PROXIMAL HUMERUS FRACTURE  POST-OPERATIVE DIAGNOSIS:  SAME  PROCEDURE:  ORIF  SURGEON:  Tonique Mendonca, Vania ReaKevin M. M.D.  ASSISTANTS: Shuford pac   ANESTHESIA:   GET + ISB  EBL: <100cc  SPECIMEN:  none  Drains: none   PATIENT DISPOSITION:  PACU - hemodynamically stable.    PLAN OF CARE: Admit for overnight observation  Dictation# [id number]   Contact # [phone]

## 2016-01-06 NOTE — H&P (Signed)
Brenda [name]    Chief Complaint: LEFT PROXIMAL HUMERUS FRACTURE HPI: The patient is a 50 y.o. female with a severely displaced left 2 part proximal humerus fracture  Past Medical History  Diagnosis Date  . Hypertension   . COPD (chronic obstructive pulmonary disease) (HCC)   . Asthma   . GERD (gastroesophageal reflux disease)     Past Surgical History  Procedure Laterality Date  . Tubal ligation    . Shoulder surgery Right   . Neck surgery    . Ankle surgery Left     History reviewed. No pertinent family history.  Social History:  reports that she has been smoking.  She does not have any smokeless tobacco history on file. She reports that she drinks alcohol. She reports that she does not use illicit drugs.  Allergies: No Known Allergies  Medications Prior to Admission  Medication Sig Dispense Refill  . cholecalciferol (VITAMIN D) 1000 units tablet Take 1,000 Units by mouth daily.    Marland Kitchen. HYDROcodone-acetaminophen (NORCO) 7.5-325 MG tablet Take 1 tablet by mouth every 6 (six) hours as needed for moderate pain.    . montelukast (SINGULAIR) 10 MG tablet Take 10 mg by mouth at bedtime.    Marland Kitchen. omeprazole (PRILOSEC) 40 MG capsule Take 40 mg by mouth daily.    Marland Kitchen. oxyCODONE-acetaminophen (PERCOCET/ROXICET) 5-325 MG tablet Take 1-2 tablets by mouth every 6 (six) hours as needed. 15 tablet 0  . potassium chloride 20 MEQ/15ML (10%) SOLN Take 20 mEq by mouth 2 (two) times daily.    . propranolol (INDERAL) 40 MG tablet Take 40 mg by mouth daily.    . traZODone (DESYREL) 150 MG tablet Take 150 mg by mouth at bedtime.       Physical Exam: left shoulder with diffuse tenderness and limited motion as noted at recent office visit  Vitals  Temp:  [97.4 F (36.3 C)-100 F (37.8 C)] 97.4 F (36.3 C) (01/12 1045) Pulse Rate:  [86-90] 90 (01/12 1132) Resp:  [11-21] 20 (01/12 1132) BP: (99-115)/(50-70) 101/50 mmHg (01/12 1132) SpO2:  [92 %-98 %] 92 % (01/12 1132) Weight:  [85.73 kg (189  lb)] 85.73 kg (189 lb) (01/12 1032)  Assessment/Plan  Impression: LEFT PROXIMAL HUMERUS FRACTURE  Plan of Action: Procedure(s): OPEN REDUCTION INTERNAL FIXATION (ORIF) PROXIMAL HUMERUS FRACTURE  Joangel Vanosdol M Chanc Kervin 01/06/2016, 11:56 AM Contact # [phone]

## 2016-01-07 ENCOUNTER — Encounter (HOSPITAL_COMMUNITY): Payer: Self-pay | Admitting: Orthopedic Surgery

## 2016-01-07 DIAGNOSIS — S42222A 2-part displaced fracture of surgical neck of left humerus, initial encounter for closed fracture: Secondary | ICD-10-CM | POA: Diagnosis not present

## 2016-01-07 MED ORDER — KETOROLAC TROMETHAMINE 30 MG/ML IJ SOLN
30.0000 mg | Freq: Four times a day (QID) | INTRAMUSCULAR | Status: DC
  Administered 2016-01-07 – 2016-01-08 (×4): 30 mg via INTRAVENOUS
  Filled 2016-01-07 (×4): qty 1

## 2016-01-07 NOTE — Progress Notes (Signed)
Brenda [name]  MRN: [id number] DOB/Age: 50/06/1966 50 y.o. Physician: Lynnea MaizesK Florine Sprenkle, M.D. 1 Day Post-Op Procedure(s) (LRB): OPEN REDUCTION INTERNAL FIXATION (ORIF) PROXIMAL HUMERUS FRACTURE (Left)  Subjective: Reports poorly controlled pain last night, describes severe pain this am. Vital Signs Temp:  [97.4 F (36.3 C)-100 F (37.8 C)] 99.1 F (37.3 C) (01/13 0647) Pulse Rate:  [77-104] 97 (01/13 0647) Resp:  [11-24] 16 (01/13 0647) BP: (87-115)/(49-70) 99/57 mmHg (01/13 0647) SpO2:  [91 %-100 %] 98 % (01/13 0647) Weight:  [85.73 kg (189 lb)] 85.73 kg (189 lb) (01/12 1032)  Lab Results  Recent Labs  01/04/16 0857 01/06/16 1023  WBC 10.5 10.3  HGB 13.1 12.4  HCT 39.8 36.2  PLT 275 264   BMET  Recent Labs  01/04/16 0857 01/06/16 1023  NA 139 136  K 3.8 3.6  CL 102 101  CO2 23 27  GLUCOSE 108* 109*  BUN 10 8  CREATININE 0.76 0.80  CALCIUM 8.7* 8.7*   INR  Date Value Ref Range Status  01/06/2016 0.96 0.00 - 1.49 Final     Exam  Dressings dry, compartments soft, intact to light touch axillary N distribution, good wrist and digital motion, n/v intact distally.  Plan Discussed with nursing staff continued efforts at obtaining improved pain control. Anticipate d/c tomorrow when pain and mobility improved. rx's and d/c instructions on chart.      Marcial Pless M Labib Cwynar 01/07/2016, 7:28 AM    Contact # [phone]

## 2016-01-07 NOTE — Anesthesia Postprocedure Evaluation (Signed)
Anesthesia Post Note  Patient: Brenda ReichertDeborah L [name]  Procedure(s) Performed: Procedure(s) (LRB): OPEN REDUCTION INTERNAL FIXATION (ORIF) PROXIMAL HUMERUS FRACTURE (Left)  Patient location during evaluation: PACU Anesthesia Type: General and Regional Level of consciousness: awake and alert Pain management: pain level controlled Vital Signs Assessment: post-procedure vital signs reviewed and stable Respiratory status: spontaneous breathing Cardiovascular status: blood pressure returned to baseline Anesthetic complications: no    Last Vitals:  Filed Vitals:   01/07/16 0647 01/07/16 1044  BP: 99/57 100/57  Pulse: 97   Temp: 37.3 C   Resp: 16     Last Pain:  Filed Vitals:   01/07/16 1426  PainSc: 6                  Kennieth RadFitzgerald, Jonesha Tsuchiya E

## 2016-01-07 NOTE — Evaluation (Signed)
Occupational Therapy Evaluation Patient Details Name: Brenda [name] MRN: [id number] DOB: 09-27-1966 Today's Date: 01/07/2016    History of Present Illness Pt is a 50 y.o. female s/p OPEN REDUCTION INTERNAL FIXATION (ORIF) PROXIMAL HUMERUS FRACTURE (Left). PMH: HTN, COPD, GERD    Clinical Impression   Pt reports she was independent with ADLs and mobility PTA. Currently pt is overall min guard for functional mobility and ADLs with the exception of mod assist for UB ADLs. Began ADL, safety, shoulder education; pt verbalized understanding. Pt tolerated elbow, wrist, and hand AROM to LUE; limited elbow ROM secondary to edema. Pt planning to d/c home with intermittent supervision from her boyfriend. Recommending HHOT for follow up in order to maximize independence and safety with ADLs and functional mobility for safe return home. Pt would benefit from continued skilled OT in order to increase independence with UB ADLs, toilet and tub transfers, and HEP.     Follow Up Recommendations  Home health OT;Supervision - Intermittent    Equipment Recommendations       Recommendations for Other Services PT consult     Precautions / Restrictions Precautions Precautions: Shoulder;Fall Type of Shoulder Precautions: Conservative protocol: NO AROM/PROM shoulder. AROM elbow, wrist, hand OK. NO pendulums. Shoulder Interventions: Shoulder sling/immobilizer;At all times;Off for dressing/bathing/exercises Precaution Booklet Issued: Yes (comment) Precaution Comments: Reviewed all precautions with pt Required Braces or Orthoses: Sling Restrictions Weight Bearing Restrictions: Yes LUE Weight Bearing: Non weight bearing      Mobility Bed Mobility Overal bed mobility: Needs Assistance Bed Mobility: Supine to Sit     Supine to sit: Min guard;HOB elevated     General bed mobility comments: Min guard for safety. Use of bed rails and HOB elevated. VCs for technique and hand placement.    Transfers Overall transfer level: Needs assistance Equipment used: None Transfers: Sit to/from Stand Sit to Stand: Min guard         General transfer comment: Min guard for safety; no physical assist needed. Good hand placement and technique.    Balance Overall balance assessment: No apparent balance deficits (not formally assessed)                                          ADL Overall ADL's : Needs assistance/impaired Eating/Feeding: Set up;Sitting   Grooming: Min guard;Standing   Upper Body Bathing: Moderate assistance;Sitting Upper Body Bathing Details (indicate cue type and reason): Educated on UB bathing technique. Lower Body Bathing: Min guard;Sit to/from stand   Upper Body Dressing : Moderate assistance;Sitting Upper Body Dressing Details (indicate cue type and reason): Educated on UB dressing technique. Lower Body Dressing: Min guard;Sit to/from stand   Toilet Transfer: Min guard;Ambulation;Comfort height toilet   Toileting- Clothing Manipulation and Hygiene: Min guard;Sit to/from stand       Functional mobility during ADLs: Min guard General ADL Comments: No family present for OT eval. Educated on sling management and wear schedule, ice for edema and pain, positioning of LUE in sitting/lying in bed, elbow/wrist/hand ex; pt verbalized understanding.     Vision     Perception     Praxis      Pertinent Vitals/Pain Pain Assessment: 0-10 Pain Score: 9  Pain Location: L shoulder Pain Descriptors / Indicators: Aching;Guarding;Grimacing Pain Intervention(s): Limited activity within patient's tolerance;Monitored during session;Repositioned;Patient requesting pain meds-RN notified;RN gave pain meds during session;Ice applied     Hand Dominance Right  Extremity/Trunk Assessment Upper Extremity Assessment Upper Extremity Assessment: LUE deficits/detail LUE Deficits / Details: Limited elbow AROM secondary to edema. AROM wrist and hand WFL.  Pt reports slight numbness in fingers that has been present since fall; no worse since sx. LUE: Unable to fully assess due to immobilization   Lower Extremity Assessment Lower Extremity Assessment: Overall WFL for tasks assessed   Cervical / Trunk Assessment Cervical / Trunk Assessment: Normal   Communication Communication Communication: No difficulties   Cognition Arousal/Alertness: Awake/alert Behavior During Therapy: WFL for tasks assessed/performed Overall Cognitive Status: Within Functional Limits for tasks assessed                     General Comments       Exercises Exercises: Shoulder     Shoulder Instructions Shoulder Instructions Donning/doffing shirt without moving shoulder: Moderate assistance (educated) Method for sponge bathing under operated UE: Moderate assistance (educated) Donning/doffing sling/immobilizer: Maximal assistance (educated) Correct positioning of sling/immobilizer: Supervision/safety (educated) ROM for elbow, wrist and digits of operated UE: Supervision/safety (educated) Sling wearing schedule (on at all times/off for ADL's): Supervision/safety (educated) Proper positioning of operated UE when showering: Supervision/safety (educated) Positioning of UE while sleeping: Minimal assistance (educated)    Home Living Family/patient expects to be discharged to:: Private residence Living Arrangements: Spouse/significant other Available Help at Discharge: Family;Available PRN/intermittently (boyfriend works Adult nurse the day) Type of Home: Mobile home Home Access: Stairs to enter Secretary/administrator of Steps: 5-6 Entrance Stairs-Rails: None Home Layout: One level     Bathroom Shower/Tub: Chief Strategy Officer: Standard     Home Equipment: None          Prior Functioning/Environment Level of Independence: Independent             OT Diagnosis: Acute pain   OT Problem List: Decreased strength;Decreased range of  motion;Decreased activity tolerance;Decreased knowledge of use of DME or AE;Decreased knowledge of precautions;Impaired UE functional use;Pain;Increased edema   OT Treatment/Interventions: Self-care/ADL training;Therapeutic exercise;Energy conservation;DME and/or AE instruction;Patient/family education    OT Goals(Current goals can be found in the care plan section) Acute Rehab OT Goals Patient Stated Goal: return to PLOF OT Goal Formulation: With patient Time For Goal Achievement: 01/21/16 Potential to Achieve Goals: Good ADL Goals Pt Will Perform Grooming: with modified independence;standing Pt Will Perform Upper Body Bathing: with supervision;sitting Pt Will Perform Upper Body Dressing: with supervision;sitting Pt Will Transfer to Toilet: with modified independence;ambulating;regular height toilet Pt Will Perform Toileting - Clothing Manipulation and hygiene: with modified independence;sit to/from stand Pt Will Perform Tub/Shower Transfer: with supervision;Tub transfer;ambulating Pt/caregiver will Perform Home Exercise Program: Increased ROM;Left upper extremity;Independently  OT Frequency: Min 3X/week   Barriers to D/C: Decreased caregiver support;Inaccessible home environment  Stairs to get into home. Boyfriend works during the day.       Co-evaluation              End of Session Equipment Utilized During Treatment: Other (comment) (sling) Nurse Communication: Patient requests pain meds  Activity Tolerance: Patient tolerated treatment well Patient left: in chair;with call bell/phone within reach   Time: 0822-0850 OT Time Calculation (min): 28 min Charges:  OT General Charges $OT Visit: 1 Procedure OT Evaluation $OT Eval Low Complexity: 1 Procedure OT Treatments $Self Care/Home Management : 8-22 mins G-Codes: OT G-codes **NOT FOR INPATIENT CLASS** Functional Assessment Tool Used: Clinical judgement Functional Limitation: Self care Self Care Current Status (Z6109):  At least 20 percent but less than 40 percent impaired,  limited or restricted Self Care Goal Status [phone](G8988): At least 1 percent but less than 20 percent impaired, limited or restricted   Gaye AlkenBailey A Natha Guin M.S., OTR/L Pager: [phone]  01/07/2016, 9:14 AM

## 2016-01-07 NOTE — Evaluation (Signed)
Physical Therapy Evaluation/ DIscharge Patient Details Name: Brenda HasDeborah L [name] MRN: [id number] DOB: 01/12/1966 Today's Date: 01/07/2016   History of Present Illness  Pt is a 50 y.o. female s/p OPEN REDUCTION INTERNAL FIXATION (ORIF) PROXIMAL HUMERUS FRACTURE (Left). PMH: HTN, COPD, GERD   Clinical Impression  Pt pleasant and moving well. Pt with education for stair training, bed mobility and functional activity with pt able to return demonstrate. Pt with assist of mom and boyfriend at D/C and no DME needs at this time. All education and mobility complete with no further needs, pt aware and agreeable.     Follow Up Recommendations No PT follow up    Equipment Recommendations  None recommended by PT    Recommendations for Other Services       Precautions / Restrictions Precautions Precautions: Shoulder;Fall Type of Shoulder Precautions: Conservative protocol: NO AROM/PROM shoulder. AROM elbow, wrist, hand OK. NO pendulums. Shoulder Interventions: Shoulder sling/immobilizer;At all times;Off for dressing/bathing/exercises Precaution Booklet Issued: Yes (comment) Precaution Comments: Reviewed all precautions with pt Required Braces or Orthoses: Sling Restrictions Weight Bearing Restrictions: Yes LUE Weight Bearing: Non weight bearing      Mobility  Bed Mobility Overal bed mobility: Needs Assistance Bed Mobility: Supine to Sit     Supine to sit: Supervision     General bed mobility comments: pt with cues for how to transfer from both sides of the bed with right side much easier and suggest sleeping upside down in the bed initially  Transfers Overall transfer level: Modified independent Equipment used: None Transfers: Sit to/from Stand Sit to Stand: Min guard         General transfer comment: Min guard for safety; no physical assist needed. Good hand placement and technique.  Ambulation/Gait Ambulation/Gait assistance: Independent Ambulation Distance (Feet): 500  Feet Assistive device: None Gait Pattern/deviations: WFL(Within Functional Limits)   Gait velocity interpretation: at or above normal speed for age/gender    Stairs Stairs: Yes Stairs assistance: Modified independent (Device/Increase time) Stair Management: One rail Left;Sideways;Alternating pattern Number of Stairs: 5 General stair comments: cues for sequence initially with pt able to demonstrate  Wheelchair Mobility    Modified Rankin (Stroke Patients Only)       Balance Overall balance assessment: No apparent balance deficits (not formally assessed)                                           Pertinent Vitals/Pain Pain Assessment: 0-10 Pain Score: 9  Pain Location: left shoulder Pain Descriptors / Indicators: Aching;Guarding Pain Intervention(s): Limited activity within patient's tolerance;Monitored during session;Premedicated before session;Patient requesting pain meds-RN notified;Repositioned;Ice applied    Home Living Family/patient expects to be discharged to:: Private residence Living Arrangements: Spouse/significant other Available Help at Discharge: Family;Available PRN/intermittently Type of Home: Mobile home Home Access: Stairs to enter Entrance Stairs-Rails: Left Entrance Stairs-Number of Steps: 3 Home Layout: One level Home Equipment: None Additional Comments: Pt is going to stay at her mother's house for the first few days    Prior Function Level of Independence: Independent               Hand Dominance   Dominant Hand: Right    Extremity/Trunk Assessment   Upper Extremity Assessment: Defer to OT evaluation       LUE Deficits / Details: Limited elbow AROM secondary to edema. AROM wrist and hand WFL. Pt reports slight numbness in  fingers that [name] been present since fall; no worse since sx.   Lower Extremity Assessment: Overall WFL for tasks assessed      Cervical / Trunk Assessment: Normal  Communication    Communication: No difficulties  Cognition Arousal/Alertness: Awake/alert Behavior During Therapy: WFL for tasks assessed/performed Overall Cognitive Status: Within Functional Limits for tasks assessed                      General Comments      Exercises Shoulder Exercises Elbow Flexion: AROM;Left;10 reps;Seated (limited secodary to edema) Wrist Flexion: AROM;Left;10 reps;Seated (full ROM) Digit Composite Flexion: AROM;Left;10 reps;Seated (full ROM) Donning/doffing shirt without moving shoulder: Moderate assistance (educated) Method for sponge bathing under operated UE: Moderate assistance (educated) Donning/doffing sling/immobilizer: Maximal assistance (educated) Correct positioning of sling/immobilizer: Supervision/safety (educated) ROM for elbow, wrist and digits of operated UE: Supervision/safety (educated) Sling wearing schedule (on at all times/off for ADL's): Supervision/safety (educated) Proper positioning of operated UE when showering: Supervision/safety (educated) Positioning of UE while sleeping: Minimal assistance (educated)      Assessment/Plan    PT Assessment Patent does not need any further PT services  PT Diagnosis Acute pain   PT Problem List    PT Treatment Interventions     PT Goals (Current goals can be found in the Care Plan section) Acute Rehab PT Goals Patient Stated Goal: return to PLOF PT Goal Formulation: All assessment and education complete, DC therapy    Frequency     Barriers to discharge        Co-evaluation               End of Session Equipment Utilized During Treatment: Gait belt;Other (comment) (sling) Activity Tolerance: Patient tolerated treatment well Patient left: in bed;with call bell/phone within reach;with nursing/sitter in room;with family/visitor present Nurse Communication: Mobility status    Functional Assessment Tool Used: clinical judgement Functional Limitation: Mobility: Walking and moving  around Mobility: Walking and Moving Around Current Status (Z6109): At least 1 percent but less than 20 percent impaired, limited or restricted Mobility: Walking and Moving Around Goal Status [phone]): At least 1 percent but less than 20 percent impaired, limited or restricted Mobility: Walking and Moving Around Discharge Status [phone]): At least 1 percent but less than 20 percent impaired, limited or restricted    Time: 1030-1046 PT Time Calculation (min) (ACUTE ONLY): 16 min   Charges:   PT Evaluation $PT Eval Low Complexity: 1 Procedure     PT G Codes:   PT G-Codes **NOT FOR INPATIENT CLASS** Functional Assessment Tool Used: clinical judgement Functional Limitation: Mobility: Walking and moving around Mobility: Walking and Moving Around Current Status (B1478): At least 1 percent but less than 20 percent impaired, limited or restricted Mobility: Walking and Moving Around Goal Status [phone]): At least 1 percent but less than 20 percent impaired, limited or restricted Mobility: Walking and Moving Around Discharge Status [phone]): At least 1 percent but less than 20 percent impaired, limited or restricted    Delorse Lek 01/07/2016, 10:53 AM Delaney Meigs, PT [phone]

## 2016-01-08 DIAGNOSIS — S42222A 2-part displaced fracture of surgical neck of left humerus, initial encounter for closed fracture: Secondary | ICD-10-CM | POA: Diagnosis not present

## 2016-01-08 NOTE — Progress Notes (Signed)
Patient discharged home with husband. Prescriptions given. Discharge information given. Patient did not have any questions.

## 2016-01-08 NOTE — Progress Notes (Signed)
Subjective: 2 Days Post-Op Procedure(s) (LRB): OPEN REDUCTION INTERNAL FIXATION (ORIF) PROXIMAL HUMERUS FRACTURE (Left)  Patient reports pain as mild to moderate.  Tolerating POs well.  Admits to BM this am.  Denies fever, chills, N/V.  Resting comfortably in chair with sling upon arrival.  States that she is ready to go home.  Objective:   VITALS:  Temp:  [98.6 F (37 C)-99 F (37.2 C)] 98.6 F (37 C) (01/14 0551) Pulse Rate:  [74-83] 80 (01/14 0551) Resp:  [18] 18 (01/14 0551) BP: (92-117)/(50-62) 99/57 mmHg (01/14 0551) SpO2:  [91 %-95 %] 91 % (01/14 0551)  General: WDWN patient in NAD. Psych:  Appropriate mood and affect. Neuro:  A&O x 3, Moving all extremities, sensation intact to light touch HEENT:  EOMs intact Chest:  Even non-labored respirations Skin:  Dressing C/D/I, no rashes or lesions Extremities: warm/dry, mild edema, no erythmea or echymosis.  No lymphadenopathy. Pulses: Radial 2+ MSK:  ROM: Elbow 0-100, MMT: Grip strength 5/5 bilaterally    LABS  Recent Labs  01/06/16 1023  HGB 12.4  WBC 10.3  PLT 264    Recent Labs  01/06/16 1023  NA 136  K 3.6  CL 101  CO2 27  BUN 8  CREATININE 0.80  GLUCOSE 109*    Recent Labs  01/06/16 1023  INR 0.96     Assessment/Plan: 2 Days Post-Op Procedure(s) (LRB): OPEN REDUCTION INTERNAL FIXATION (ORIF) PROXIMAL HUMERUS FRACTURE (Left)  Up with therapy  D/C IV fluids D/C home Plan for post-op f/u with Dr. Rennis ChrisSupple in the office. Prescriptions on chart.  Alfredo MartinezJustin Elesia Pemberton, PA-C, [facility] Office:  [phone]

## 2016-01-08 NOTE — Progress Notes (Signed)
Occupational Therapy Treatment Patient Details Name: Brenda HasDeborah L [name] MRN: [id number] DOB: 08/07/1966 Today's Date: 01/08/2016    History of present illness Pt is a 50 y.o. female s/p OPEN REDUCTION INTERNAL FIXATION (ORIF) PROXIMAL HUMERUS FRACTURE (Left). PMH: HTN, COPD, GERD    OT comments  Pt making good progress toward OT goals. Pt able to teach back all education and demonstrated understanding of LUE elbow, wrist, hand AROM. Pt with increased edema in L hand and forearm; educated on edema management strategies. Upgraded d/c plan to no OT follow up secondary to pts progress with therapy. Pt ready to d/c from OT standpoint but will continue to follow acutely.   Follow Up Recommendations  No OT follow up;Supervision - Intermittent    Equipment Recommendations  None recommended by OT    Recommendations for Other Services      Precautions / Restrictions Precautions Precautions: Shoulder;Fall Type of Shoulder Precautions: Conservative protocol: NO AROM/PROM shoulder. AROM elbow, wrist, hand OK. NO pendulums. Shoulder Interventions: Shoulder sling/immobilizer;At all times;Off for dressing/bathing/exercises Precaution Comments: Reviewed all precautions with pt and boyfriend Required Braces or Orthoses: Sling Restrictions Weight Bearing Restrictions: Yes LUE Weight Bearing: Non weight bearing       Mobility Bed Mobility Overal bed mobility: Needs Assistance Bed Mobility: Supine to Sit     Supine to sit: Supervision     General bed mobility comments: Good technique. Supervision for safety, no physical assist required.  Transfers Overall transfer level: Modified independent                    Balance Overall balance assessment: No apparent balance deficits (not formally assessed)                                 ADL Overall ADL's : Needs assistance/impaired         Upper Body Bathing: Minimal assitance;Sitting Upper Body Bathing Details  (indicate cue type and reason): Pt able to teach back technique.     Upper Body Dressing : Moderate assistance;Sitting Upper Body Dressing Details (indicate cue type and reason): Pt able to teach back technique. Mod assist to don sling.                 Functional mobility during ADLs: Supervision/safety General ADL Comments: Pts boyfriend present for OT session. Pt able to teach back sling management and wear schedule, LUE positioning in bed and chair, LUE elbow, wrist, and hand exercises. Pt with increased edema in hand/fingers; educated on edema management techniques-raising hand above level of elbow in sling, hand pumps, ice.      Vision                     Perception     Praxis      Cognition   Behavior During Therapy: [facility] for tasks assessed/performed Overall Cognitive Status: Within Functional Limits for tasks assessed                       Extremity/Trunk Assessment               Exercises Shoulder Exercises Elbow Flexion: AROM;Left;10 reps;Seated (full ROM) Wrist Flexion: AROM;Left;10 reps;Seated (full ROM) Digit Composite Flexion: AROM;Left;10 reps;Seated (full ROM) Donning/doffing shirt without moving shoulder: Minimal assistance;Patient able to independently direct caregiver Method for sponge bathing under operated UE: Minimal assistance;Patient able to independently direct caregiver Donning/doffing sling/immobilizer: Moderate assistance;Patient able  to independently direct caregiver Correct positioning of sling/immobilizer: Supervision/safety ROM for elbow, wrist and digits of operated UE: Modified independent Sling wearing schedule (on at all times/off for ADL's): Modified independent Proper positioning of operated UE when showering: Supervision/safety Positioning of UE while sleeping: Minimal assistance;Patient able to independently direct caregiver   Shoulder Instructions Shoulder Instructions Donning/doffing shirt without moving  shoulder: Minimal assistance;Patient able to independently direct caregiver Method for sponge bathing under operated UE: Minimal assistance;Patient able to independently direct caregiver Donning/doffing sling/immobilizer: Moderate assistance;Patient able to independently direct caregiver Correct positioning of sling/immobilizer: Supervision/safety ROM for elbow, wrist and digits of operated UE: Modified independent Sling wearing schedule (on at all times/off for ADL's): Modified independent Proper positioning of operated UE when showering: Supervision/safety Positioning of UE while sleeping: Minimal assistance;Patient able to independently direct caregiver     General Comments      Pertinent Vitals/ Pain       Pain Assessment: 0-10 Pain Score: 7  Pain Location: L shoulder Pain Descriptors / Indicators: Aching Pain Intervention(s): Limited activity within patient's tolerance;Monitored during session;Repositioned;Ice applied  Home Living                                          Prior Functioning/Environment              Frequency Min 3X/week     Progress Toward Goals  OT Goals(current goals can now be found in the care plan section)  Progress towards OT goals: Progressing toward goals  Acute Rehab OT Goals Patient Stated Goal: to go home OT Goal Formulation: With patient  Plan Discharge plan needs to be updated    Co-evaluation                 End of Session Equipment Utilized During Treatment: Other (comment) (sling)   Activity Tolerance Patient tolerated treatment well   Patient Left in chair;with call bell/phone within reach;with family/visitor present   Nurse Communication          Time: 1610-9604 OT Time Calculation (min): 18 min  Charges: OT General Charges $OT Visit: 1 Procedure OT Treatments $Self Care/Home Management : 8-22 mins  Gaye Alken M.S., OTR/L Pager: [phone]  01/08/2016, 9:45 AM

## 2016-01-24 NOTE — Discharge Summary (Signed)
PATIENT ID:      Brenda [name]  MRN:     [id number] DOB/AGE:    [birth year] / 50 y.o.     DISCHARGE SUMMARY  ADMISSION DATE:    01/06/2016 DISCHARGE DATE:  01/08/2016  ADMISSION DIAGNOSIS: LEFT PROXIMAL HUMERUS FRACTURE Past Medical History  Diagnosis Date  . Hypertension   . COPD (chronic obstructive pulmonary disease) (HCC)   . Asthma   . GERD (gastroesophageal reflux disease)     DISCHARGE DIAGNOSIS:   Active Problems:   Proximal humerus fracture   PROCEDURE: Procedure(s): OPEN REDUCTION INTERNAL FIXATION (ORIF) PROXIMAL HUMERUS FRACTURE on 01/06/2016  CONSULTS:     HISTORY:  See H&P in chart.  HOSPITAL COURSE:  Brenda [name] is a 50 y.o. admitted on 01/06/2016 with a chief complaint of severe left shoulder pain following mechanical fall, and found to have a diagnosis of LEFT PROXIMAL HUMERUS FRACTURE.  They were brought to the operating room on 01/06/2016 and underwent Procedure(s): OPEN REDUCTION INTERNAL FIXATION (ORIF) PROXIMAL HUMERUS FRACTURE.    They were given perioperative antibiotics:  Anti-infectives    Start     Dose/Rate Route Frequency Ordered Stop   01/06/16 1830  ceFAZolin (ANCEF) IVPB 2 g/50 mL premix     2 g 100 mL/hr over 30 Minutes Intravenous Every 6 hours 01/06/16 1653 01/07/16 1229   01/06/16 1215  ceFAZolin (ANCEF) IVPB 2 g/50 mL premix     2 g 100 mL/hr over 30 Minutes Intravenous On call to O.R. 01/06/16 1011 01/06/16 1245   01/06/16 1022  ceFAZolin (ANCEF) 2-3 GM-% IVPB SOLR    Comments:  Scronce, Trina   : cabinet override      01/06/16 1022 01/06/16 2229    .  Patient underwent the above named procedure and tolerated it well. The following day they were hemodynamically stable and pain was poorly controlled on oral analgesics requiring additional day for pain control. They were neurovascularly intact to the operative extremity. OT was ordered and worked with patient per protocol. They were medically and orthopaedically stable for  discharge on 01/08/2016 after better pain control was achieved.   DIAGNOSTIC STUDIES:  RECENT RADIOGRAPHIC STUDIES :  Dg Shoulder Left  01/04/2016  CLINICAL DATA:  50 year old female with proximal left humerus pain after falling last night on the ice EXAM: LEFT SHOULDER - 2+ VIEW COMPARISON:  Concurrently obtained radiographs of the left humerus FINDINGS: Comminuted and displaced fracture through the surgical neck of the humerus. The humeral head remains located with respect to the glenoid. The visualized thorax is unremarkable. The acromioclavicular joint remains intact. IMPRESSION: 1. Comminuted fracture through the surgical neck of the humerus. 2. The humeral head remains located with respect to the glenoid. Electronically Signed   By: Malachy Moan M.D.   On: 01/04/2016 07:53   Dg Humerus Left  01/06/2016  CLINICAL DATA:  ORIF proximal humerus fracture EXAM: LEFT HUMERUS - 2+ VIEW; DG C-ARM 61-120 MIN COMPARISON:  None FLUOROSCOPY TIME:  1 minutes 7 seconds FINDINGS: Comminuted fracture of the surgical neck of the left proximal humerus transfixed with a lateral metallic sideplate and multiple interlocking screws without failure complication. Alignment is near anatomic. No dislocation. IMPRESSION: ORIF proximal left humeral fracture. Electronically Signed   By: Elige Ko   On: 01/06/2016 14:50   Dg Humerus Left  01/04/2016  CLINICAL DATA:  Fall, shoulder pain, injury EXAM: LEFT HUMERUS - 2+ VIEW COMPARISON:  01/04/2016 FINDINGS: There is an acute displaced and comminuted fracture of  the left proximal humerus surgical neck. Distal fragment is displaced anteriorly. Humeral head fragment is rotated laterally. Distal aspect of the humerus appears intact. IMPRESSION: Acute displaced left proximal humerus surgical neck fracture. Electronically Signed   By: Judie Petit.  Shick M.D.   On: 01/04/2016 07:54   Dg C-arm 61-120 Min  01/06/2016  CLINICAL DATA:  ORIF proximal humerus fracture EXAM: LEFT HUMERUS - 2+  VIEW; DG C-ARM 61-120 MIN COMPARISON:  None FLUOROSCOPY TIME:  1 minutes 7 seconds FINDINGS: Comminuted fracture of the surgical neck of the left proximal humerus transfixed with a lateral metallic sideplate and multiple interlocking screws without failure complication. Alignment is near anatomic. No dislocation. IMPRESSION: ORIF proximal left humeral fracture. Electronically Signed   By: Elige Ko   On: 01/06/2016 14:50    RECENT VITAL SIGNS:  No data found. Marland Kitchen  RECENT EKG RESULTS:   No orders found for this or any previous visit.  DISCHARGE INSTRUCTIONS:  Discharge Instructions    Call MD / Call 911    Complete by:  As directed   If you experience chest pain or shortness of breath, CALL 911 and be transported to the hospital emergency room.  If you develope a fever above 101 F, pus (white drainage) or increased drainage or redness at the wound, or calf pain, call your surgeon's office.     Constipation Prevention    Complete by:  As directed   Drink plenty of fluids.  Prune juice may be helpful.  You may use a stool softener, such as Colace (over the counter) 100 mg twice a day.  Use MiraLax (over the counter) for constipation as needed.     Diet - low sodium heart healthy    Complete by:  As directed      Increase activity slowly as tolerated    Complete by:  As directed            DISCHARGE MEDICATIONS:     Medication List    STOP taking these medications        HYDROcodone-acetaminophen 7.5-325 MG tablet  Commonly known as:  NORCO      TAKE these medications        cholecalciferol 1000 units tablet  Commonly known as:  VITAMIN D  Take 1,000 Units by mouth daily.     methocarbamol 500 MG tablet  Commonly known as:  ROBAXIN  Take 1 tablet (500 mg total) by mouth every 8 (eight) hours as needed for muscle spasms.     montelukast 10 MG tablet  Commonly known as:  SINGULAIR  Take 10 mg by mouth at bedtime.     omeprazole 40 MG capsule  Commonly known as:  PRILOSEC   Take 40 mg by mouth daily.     ondansetron 4 MG tablet  Commonly known as:  ZOFRAN  Take 1 tablet (4 mg total) by mouth every 8 (eight) hours as needed for nausea or vomiting.     oxyCODONE-acetaminophen 5-325 MG tablet  Commonly known as:  PERCOCET/ROXICET  Take 1-2 tablets by mouth every 6 (six) hours as needed.     oxyCODONE-acetaminophen 5-325 MG tablet  Commonly known as:  PERCOCET  Take 1-2 tablets by mouth every 4 (four) hours as needed.     potassium chloride 20 MEQ/15ML (10%) Soln  Take 20 mEq by mouth 2 (two) times daily.     propranolol 40 MG tablet  Commonly known as:  INDERAL  Take 40 mg by mouth daily.  traZODone 150 MG tablet  Commonly known as:  DESYREL  Take 150 mg by mouth at bedtime.        FOLLOW UP VISIT:       Follow-up Information    Follow up with Vania Rea SUPPLE, MD.   Specialty:  Orthopedic Surgery   Why:  call to be seen in 10-14 days   Contact information:   [address] [place] [address] [phone]       DISCHARGE TO: Home DISPOSITION: Good  DISCHARGE CONDITION:  Rodolph Bong for Dr. Francena Hanly 01/24/2016, 8:52 AM

## 2016-03-14 ENCOUNTER — Encounter (HOSPITAL_COMMUNITY): Payer: Self-pay | Admitting: *Deleted

## 2016-03-14 NOTE — Progress Notes (Signed)
Brenda [name] denies chest pain - has shortness of breath at times.  Patient states that she has a heart murmer- MVP, but was told it is nothing to worry about.  Patient reports that she had an ECHO a couple years ago.  [facility]  PCP is Marry GuanKathleen McNutt Smothers, [facility], I will request records from the office.  I instructed Brenda Soltau to not take any more naproxen, and no Ibuprofen, Aspirin or Aspirin Products.

## 2016-03-15 MED ORDER — CHLORHEXIDINE GLUCONATE 4 % EX LIQD
60.0000 mL | Freq: Once | CUTANEOUS | Status: DC
Start: 2016-03-16 — End: 2016-03-16

## 2016-03-15 MED ORDER — CEFAZOLIN SODIUM-DEXTROSE 2-4 GM/100ML-% IV SOLN
2.0000 g | INTRAVENOUS | Status: AC
  Administered 2016-03-16: 2 g via INTRAVENOUS
  Filled 2016-03-15: qty 100

## 2016-03-16 ENCOUNTER — Ambulatory Visit (HOSPITAL_COMMUNITY): Payer: BLUE CROSS/BLUE SHIELD | Admitting: Anesthesiology

## 2016-03-16 ENCOUNTER — Encounter (HOSPITAL_COMMUNITY)
Admission: RE | Disposition: A | Discharge status: Home / Self Care | Payer: Self-pay | Source: Ambulatory Visit | Attending: Orthopedic Surgery

## 2016-03-16 ENCOUNTER — Encounter (HOSPITAL_COMMUNITY): Payer: Self-pay | Admitting: *Deleted

## 2016-03-16 ENCOUNTER — Ambulatory Visit (HOSPITAL_COMMUNITY)
Admission: RE | Admit: 2016-03-16 | Discharge: 2016-03-17 | Disposition: A | Discharge status: Home / Self Care | Payer: BLUE CROSS/BLUE SHIELD | Source: Ambulatory Visit | Attending: Orthopedic Surgery | Admitting: Orthopedic Surgery

## 2016-03-16 DIAGNOSIS — S42209A Unspecified fracture of upper end of unspecified humerus, initial encounter for closed fracture: Secondary | ICD-10-CM | POA: Diagnosis present

## 2016-03-16 DIAGNOSIS — F17200 Nicotine dependence, unspecified, uncomplicated: Secondary | ICD-10-CM | POA: Insufficient documentation

## 2016-03-16 DIAGNOSIS — Z6832 Body mass index (BMI) 32.0-32.9, adult: Secondary | ICD-10-CM | POA: Diagnosis not present

## 2016-03-16 DIAGNOSIS — J449 Chronic obstructive pulmonary disease, unspecified: Secondary | ICD-10-CM | POA: Diagnosis not present

## 2016-03-16 DIAGNOSIS — S42202P Unspecified fracture of upper end of left humerus, subsequent encounter for fracture with malunion: Secondary | ICD-10-CM | POA: Insufficient documentation

## 2016-03-16 DIAGNOSIS — Z79899 Other long term (current) drug therapy: Secondary | ICD-10-CM | POA: Diagnosis not present

## 2016-03-16 DIAGNOSIS — X58XXXD Exposure to other specified factors, subsequent encounter: Secondary | ICD-10-CM | POA: Insufficient documentation

## 2016-03-16 DIAGNOSIS — J45909 Unspecified asthma, uncomplicated: Secondary | ICD-10-CM | POA: Diagnosis not present

## 2016-03-16 DIAGNOSIS — K219 Gastro-esophageal reflux disease without esophagitis: Secondary | ICD-10-CM | POA: Insufficient documentation

## 2016-03-16 DIAGNOSIS — S42202G Unspecified fracture of upper end of left humerus, subsequent encounter for fracture with delayed healing: Secondary | ICD-10-CM

## 2016-03-16 DIAGNOSIS — S42202K Unspecified fracture of upper end of left humerus, subsequent encounter for fracture with nonunion: Secondary | ICD-10-CM | POA: Insufficient documentation

## 2016-03-16 DIAGNOSIS — I10 Essential (primary) hypertension: Secondary | ICD-10-CM | POA: Insufficient documentation

## 2016-03-16 DIAGNOSIS — Z7951 Long term (current) use of inhaled steroids: Secondary | ICD-10-CM | POA: Insufficient documentation

## 2016-03-16 HISTORY — DX: Reserved for inherently not codable concepts without codable children: IMO0001

## 2016-03-16 HISTORY — DX: Cardiac murmur, unspecified: R01.1

## 2016-03-16 HISTORY — PX: REVERSE SHOULDER ARTHROPLASTY: SHX5054

## 2016-03-16 HISTORY — PX: HARDWARE REMOVAL: SHX979

## 2016-03-16 LAB — CBC
HEMATOCRIT: 42.6 % (ref 36.0–46.0)
HEMOGLOBIN: 14 g/dL (ref 12.0–15.0)
MCH: 31.7 pg (ref 26.0–34.0)
MCHC: 32.9 g/dL (ref 30.0–36.0)
MCV: 96.4 fL (ref 78.0–100.0)
Platelets: 265 10*3/uL (ref 150–400)
RBC: 4.42 MIL/uL (ref 3.87–5.11)
RDW: 14.7 % (ref 11.5–15.5)
WBC: 7.8 10*3/uL (ref 4.0–10.5)

## 2016-03-16 LAB — TYPE AND SCREEN
ABO/RH(D): O POS
ANTIBODY SCREEN: NEGATIVE

## 2016-03-16 LAB — COMPREHENSIVE METABOLIC PANEL
ALBUMIN: 4 g/dL (ref 3.5–5.0)
ALT: 15 U/L (ref 14–54)
AST: 17 U/L (ref 15–41)
Alkaline Phosphatase: 69 U/L (ref 38–126)
Anion gap: 13 (ref 5–15)
BUN: 10 mg/dL (ref 6–20)
CHLORIDE: 104 mmol/L (ref 101–111)
CO2: 21 mmol/L — AB (ref 22–32)
CREATININE: 0.81 mg/dL (ref 0.44–1.00)
Calcium: 9.3 mg/dL (ref 8.9–10.3)
GFR calc Af Amer: >60 mL/min (ref >60)
GFR calc non Af Amer: >60 mL/min (ref >60)
Glucose, Bld: 96 mg/dL (ref 65–99)
POTASSIUM: 4 mmol/L (ref 3.5–5.1)
SODIUM: 138 mmol/L (ref 135–145)
Total Bilirubin: 1 mg/dL (ref 0.3–1.2)
Total Protein: 6.9 g/dL (ref 6.5–8.1)

## 2016-03-16 LAB — SURGICAL PCR SCREEN
MRSA, PCR: NEGATIVE
Staphylococcus aureus: POSITIVE — AB

## 2016-03-16 LAB — ABO/RH: ABO/RH(D): O POS

## 2016-03-16 SURGERY — ARTHROPLASTY, SHOULDER, TOTAL, REVERSE
Anesthesia: Regional | Site: Shoulder | Laterality: Left

## 2016-03-16 MED ORDER — BISACODYL 5 MG PO TBEC: 5.0000 mg | DELAYED_RELEASE_TABLET | Freq: Every day | ORAL | Status: DC | PRN

## 2016-03-16 MED ORDER — HYDROMORPHONE HCL 1 MG/ML IJ SOLN
INTRAMUSCULAR | Status: AC
  Filled 2016-03-16: qty 1

## 2016-03-16 MED ORDER — ONDANSETRON HCL 4 MG/2ML IJ SOLN
INTRAMUSCULAR | Status: AC
  Filled 2016-03-16: qty 2

## 2016-03-16 MED ORDER — DIPHENHYDRAMINE HCL 50 MG/ML IJ SOLN
INTRAMUSCULAR | Status: AC
  Filled 2016-03-16: qty 1

## 2016-03-16 MED ORDER — POTASSIUM CHLORIDE CRYS ER 20 MEQ PO TBCR
20.0000 meq | EXTENDED_RELEASE_TABLET | Freq: Two times a day (BID) | ORAL | Status: DC
Start: 2016-03-16 — End: 2016-03-17
  Administered 2016-03-16 – 2016-03-17 (×2): 20 meq via ORAL
  Filled 2016-03-16 (×2): qty 1

## 2016-03-16 MED ORDER — HYDROMORPHONE HCL 1 MG/ML IJ SOLN
0.2500 mg | INTRAMUSCULAR | Status: DC | PRN
  Administered 2016-03-16 (×2): 0.5 mg via INTRAVENOUS

## 2016-03-16 MED ORDER — FENTANYL CITRATE (PF) 250 MCG/5ML IJ SOLN
INTRAMUSCULAR | Status: AC
  Filled 2016-03-16: qty 5

## 2016-03-16 MED ORDER — MONTELUKAST SODIUM 10 MG PO TABS
10.0000 mg | ORAL_TABLET | Freq: Every day | ORAL | Status: DC
  Administered 2016-03-16: 10 mg via ORAL
  Filled 2016-03-16: qty 1

## 2016-03-16 MED ORDER — ONDANSETRON HCL 4 MG/2ML IJ SOLN: 4.0000 mg | Freq: Four times a day (QID) | INTRAMUSCULAR | Status: DC | PRN

## 2016-03-16 MED ORDER — HYDROMORPHONE HCL 1 MG/ML IJ SOLN: 1.0000 mg | INTRAMUSCULAR | Status: DC | PRN

## 2016-03-16 MED ORDER — METOCLOPRAMIDE HCL 5 MG PO TABS: 5.0000 mg | ORAL_TABLET | Freq: Three times a day (TID) | ORAL | Status: DC | PRN

## 2016-03-16 MED ORDER — METOCLOPRAMIDE HCL 5 MG/ML IJ SOLN: 5.0000 mg | Freq: Three times a day (TID) | INTRAMUSCULAR | Status: DC | PRN

## 2016-03-16 MED ORDER — ROCURONIUM BROMIDE 100 MG/10ML IV SOLN
INTRAVENOUS | Status: DC | PRN
  Administered 2016-03-16: 50 mg via INTRAVENOUS

## 2016-03-16 MED ORDER — ACETAMINOPHEN 325 MG PO TABS: 325.0000 mg | ORAL_TABLET | ORAL | Status: DC | PRN

## 2016-03-16 MED ORDER — METHOCARBAMOL 500 MG PO TABS: 500.0000 mg | ORAL_TABLET | Freq: Three times a day (TID) | ORAL | Status: DC | PRN

## 2016-03-16 MED ORDER — POLYETHYLENE GLYCOL 3350 17 G PO PACK
17.0000 g | PACK | Freq: Every day | ORAL | Status: DC | PRN
Start: 2016-03-16 — End: 2016-03-17

## 2016-03-16 MED ORDER — MENTHOL 3 MG MT LOZG: 1.0000 | LOZENGE | OROMUCOSAL | Status: DC | PRN

## 2016-03-16 MED ORDER — ROCURONIUM BROMIDE 50 MG/5ML IV SOLN
INTRAVENOUS | Status: AC
  Filled 2016-03-16: qty 1

## 2016-03-16 MED ORDER — OXYCODONE HCL 5 MG PO TABS
5.0000 mg | ORAL_TABLET | ORAL | Status: DC | PRN
  Administered 2016-03-16 – 2016-03-17 (×5): 10 mg via ORAL
  Filled 2016-03-16 (×5): qty 2

## 2016-03-16 MED ORDER — ONDANSETRON HCL 4 MG PO TABS: 4.0000 mg | ORAL_TABLET | Freq: Four times a day (QID) | ORAL | Status: DC | PRN

## 2016-03-16 MED ORDER — PHENYLEPHRINE HCL 10 MG/ML IJ SOLN
10.0000 mg | INTRAVENOUS | Status: DC | PRN
  Administered 2016-03-16: 20 ug/min via INTRAVENOUS

## 2016-03-16 MED ORDER — LIDOCAINE HCL (CARDIAC) 20 MG/ML IV SOLN
INTRAVENOUS | Status: DC | PRN
  Administered 2016-03-16: 40 mg via INTRAVENOUS

## 2016-03-16 MED ORDER — TRAZODONE HCL 100 MG PO TABS
150.0000 mg | ORAL_TABLET | Freq: Every day | ORAL | Status: DC
  Administered 2016-03-16: 150 mg via ORAL
  Filled 2016-03-16: qty 1

## 2016-03-16 MED ORDER — MIDAZOLAM HCL 2 MG/2ML IJ SOLN
INTRAMUSCULAR | Status: AC
  Administered 2016-03-16: 2 mg
  Filled 2016-03-16: qty 2

## 2016-03-16 MED ORDER — TIOTROPIUM BROMIDE MONOHYDRATE 18 MCG IN CAPS
18.0000 ug | ORAL_CAPSULE | Freq: Every day | RESPIRATORY_TRACT | Status: DC
  Administered 2016-03-17: 18 ug via RESPIRATORY_TRACT
  Filled 2016-03-16: qty 5

## 2016-03-16 MED ORDER — PROPOFOL 10 MG/ML IV BOLUS
INTRAVENOUS | Status: DC | PRN
  Administered 2016-03-16: 140 mg via INTRAVENOUS

## 2016-03-16 MED ORDER — MUPIROCIN 2 % EX OINT
1.0000 "application " | TOPICAL_OINTMENT | Freq: Two times a day (BID) | CUTANEOUS | Status: DC
  Administered 2016-03-16 – 2016-03-17 (×2): 1 via NASAL
  Filled 2016-03-16: qty 22

## 2016-03-16 MED ORDER — FENTANYL CITRATE (PF) 100 MCG/2ML IJ SOLN
INTRAMUSCULAR | Status: DC | PRN
  Administered 2016-03-16: 150 ug via INTRAVENOUS
  Administered 2016-03-16 (×2): 50 ug via INTRAVENOUS

## 2016-03-16 MED ORDER — PROPOFOL 10 MG/ML IV BOLUS
INTRAVENOUS | Status: AC
  Filled 2016-03-16: qty 20

## 2016-03-16 MED ORDER — LACTATED RINGERS IV SOLN
INTRAVENOUS | Status: DC
  Administered 2016-03-16 (×2): via INTRAVENOUS

## 2016-03-16 MED ORDER — MUPIROCIN 2 % EX OINT
TOPICAL_OINTMENT | CUTANEOUS | Status: AC
  Filled 2016-03-16: qty 22

## 2016-03-16 MED ORDER — ALUM & MAG HYDROXIDE-SIMETH 200-200-20 MG/5ML PO SUSP: 30.0000 mL | ORAL | Status: DC | PRN

## 2016-03-16 MED ORDER — LACTATED RINGERS IV SOLN
INTRAVENOUS | Status: DC
Start: 2016-03-16 — End: 2016-03-17
  Administered 2016-03-16 (×2): via INTRAVENOUS

## 2016-03-16 MED ORDER — CHLORHEXIDINE GLUCONATE CLOTH 2 % EX PADS
6.0000 | MEDICATED_PAD | Freq: Every day | CUTANEOUS | Status: DC
  Administered 2016-03-16 – 2016-03-17 (×2): 6 via TOPICAL

## 2016-03-16 MED ORDER — MOMETASONE FURO-FORMOTEROL FUM 200-5 MCG/ACT IN AERO
2.0000 | INHALATION_SPRAY | Freq: Two times a day (BID) | RESPIRATORY_TRACT | Status: DC
  Administered 2016-03-17: 2 via RESPIRATORY_TRACT
  Filled 2016-03-16: qty 8.8

## 2016-03-16 MED ORDER — OXYCODONE HCL 5 MG/5ML PO SOLN: 5.0000 mg | Freq: Once | ORAL | Status: AC | PRN

## 2016-03-16 MED ORDER — KETOROLAC TROMETHAMINE 15 MG/ML IJ SOLN
15.0000 mg | Freq: Four times a day (QID) | INTRAMUSCULAR | Status: AC
  Administered 2016-03-16 – 2016-03-17 (×4): 15 mg via INTRAVENOUS
  Filled 2016-03-16 (×4): qty 1

## 2016-03-16 MED ORDER — ACETAMINOPHEN 325 MG PO TABS: 650.0000 mg | ORAL_TABLET | Freq: Four times a day (QID) | ORAL | Status: DC | PRN

## 2016-03-16 MED ORDER — OXYCODONE HCL 5 MG PO TABS
ORAL_TABLET | ORAL | Status: AC
Start: 2016-03-16 — End: 2016-03-17
  Filled 2016-03-16: qty 1

## 2016-03-16 MED ORDER — OXYCODONE HCL 5 MG PO TABS
5.0000 mg | ORAL_TABLET | Freq: Once | ORAL | Status: AC | PRN
  Administered 2016-03-16: 5 mg via ORAL

## 2016-03-16 MED ORDER — PANTOPRAZOLE SODIUM 40 MG PO TBEC
40.0000 mg | DELAYED_RELEASE_TABLET | Freq: Every day | ORAL | Status: DC
  Administered 2016-03-17: 40 mg via ORAL
  Filled 2016-03-16: qty 1

## 2016-03-16 MED ORDER — ONDANSETRON HCL 4 MG/2ML IJ SOLN
INTRAMUSCULAR | Status: DC | PRN
  Administered 2016-03-16: 4 mg via INTRAVENOUS

## 2016-03-16 MED ORDER — 0.9 % SODIUM CHLORIDE (POUR BTL) OPTIME
TOPICAL | Status: DC | PRN
  Administered 2016-03-16: 1000 mL

## 2016-03-16 MED ORDER — ACETAMINOPHEN 160 MG/5ML PO SOLN
325.0000 mg | ORAL | Status: DC | PRN
  Filled 2016-03-16: qty 20.3

## 2016-03-16 MED ORDER — CEFAZOLIN SODIUM-DEXTROSE 2-4 GM/100ML-% IV SOLN
2.0000 g | Freq: Four times a day (QID) | INTRAVENOUS | Status: AC
  Administered 2016-03-16 – 2016-03-17 (×3): 2 g via INTRAVENOUS
  Filled 2016-03-16 (×3): qty 100

## 2016-03-16 MED ORDER — BUPIVACAINE-EPINEPHRINE (PF) 0.5% -1:200000 IJ SOLN
INTRAMUSCULAR | Status: DC | PRN
  Administered 2016-03-16: 30 mL via PERINEURAL

## 2016-03-16 MED ORDER — FENTANYL CITRATE (PF) 100 MCG/2ML IJ SOLN
INTRAMUSCULAR | Status: AC
  Administered 2016-03-16: 50 ug
  Filled 2016-03-16: qty 2

## 2016-03-16 MED ORDER — PHENOL 1.4 % MT LIQD: 1.0000 | OROMUCOSAL | Status: DC | PRN

## 2016-03-16 MED ORDER — ACETAMINOPHEN 650 MG RE SUPP
650.0000 mg | Freq: Four times a day (QID) | RECTAL | Status: DC | PRN
Start: 2016-03-16 — End: 2016-03-17

## 2016-03-16 MED ORDER — AMLODIPINE BESYLATE 10 MG PO TABS
10.0000 mg | ORAL_TABLET | Freq: Every day | ORAL | Status: DC
  Administered 2016-03-17: 10 mg via ORAL
  Filled 2016-03-16: qty 1

## 2016-03-16 MED ORDER — DIPHENHYDRAMINE HCL 50 MG/ML IJ SOLN
INTRAMUSCULAR | Status: DC | PRN
  Administered 2016-03-16: 25 mg via INTRAVENOUS

## 2016-03-16 MED ORDER — EPHEDRINE SULFATE 50 MG/ML IJ SOLN
INTRAMUSCULAR | Status: DC | PRN
  Administered 2016-03-16 (×2): 5 mg via INTRAVENOUS

## 2016-03-16 MED ORDER — LIDOCAINE HCL (CARDIAC) 20 MG/ML IV SOLN
INTRAVENOUS | Status: AC
  Filled 2016-03-16: qty 5

## 2016-03-16 MED ORDER — MAGNESIUM CITRATE PO SOLN
1.0000 | Freq: Once | ORAL | Status: DC | PRN
Start: 2016-03-16 — End: 2016-03-17

## 2016-03-16 MED ORDER — DOCUSATE SODIUM 100 MG PO CAPS
100.0000 mg | ORAL_CAPSULE | Freq: Two times a day (BID) | ORAL | Status: DC
Start: 2016-03-16 — End: 2016-03-17
  Administered 2016-03-16 – 2016-03-17 (×2): 100 mg via ORAL
  Filled 2016-03-16 (×2): qty 1

## 2016-03-16 SURGICAL SUPPLY — 84 items
ADH SKN CLS APL DERMABOND .7 (GAUZE/BANDAGES/DRESSINGS) ×1
AID PSTN UNV HD RSTRNT DISP (MISCELLANEOUS) ×1
BASEPLATE GLENOID SHLDR SM (Shoulder) ×2 IMPLANT
BLADE SAW SGTL 83.5X18.5 (BLADE) ×3 IMPLANT
BRUSH FEMORAL CANAL (MISCELLANEOUS) IMPLANT
BSPLAT GLND SM PRFT SHLDR CA (Shoulder) ×1 IMPLANT
COVER SURGICAL LIGHT HANDLE (MISCELLANEOUS) ×3 IMPLANT
CUP SUT UNIV REVERS 36+2 LEFT (Cup) ×2 IMPLANT
DERMABOND ADVANCED (GAUZE/BANDAGES/DRESSINGS) ×2
DERMABOND ADVANCED .7 DNX12 (GAUZE/BANDAGES/DRESSINGS) ×1 IMPLANT
DRAPE ORTHO SPLIT 77X108 STRL (DRAPES) ×6
DRAPE SURG 17X11 SM STRL (DRAPES) ×3 IMPLANT
DRAPE SURG ORHT 6 SPLT 77X108 (DRAPES) ×2 IMPLANT
DRAPE U-SHAPE 47X51 STRL (DRAPES) ×3 IMPLANT
DRILL BIT 7/64X5 (BIT) ×3 IMPLANT
DRSG AQUACEL AG ADV 3.5X10 (GAUZE/BANDAGES/DRESSINGS) ×3 IMPLANT
DRSG MEPILEX BORDER 4X8 (GAUZE/BANDAGES/DRESSINGS) IMPLANT
DURAPREP 26ML APPLICATOR (WOUND CARE) ×3 IMPLANT
ELECT BLADE 4.0 EZ CLEAN MEGAD (MISCELLANEOUS) ×3
ELECT CAUTERY BLADE 6.4 (BLADE) ×3 IMPLANT
ELECT REM PT RETURN 9FT ADLT (ELECTROSURGICAL) ×3
ELECTRODE BLDE 4.0 EZ CLN MEGD (MISCELLANEOUS) ×1 IMPLANT
ELECTRODE REM PT RTRN 9FT ADLT (ELECTROSURGICAL) ×1 IMPLANT
FACESHIELD WRAPAROUND (MASK) ×9 IMPLANT
FACESHIELD WRAPAROUND OR TEAM (MASK) ×3 IMPLANT
GLENOSPHERE LATERAL 36MM+4 (Shoulder) ×2 IMPLANT
GLOVE BIO SURGEON STRL SZ7.5 (GLOVE) ×3 IMPLANT
GLOVE BIO SURGEON STRL SZ8 (GLOVE) ×3 IMPLANT
GLOVE EUDERMIC 7 POWDERFREE (GLOVE) ×3 IMPLANT
GLOVE SS BIOGEL STRL SZ 7.5 (GLOVE) ×1 IMPLANT
GLOVE SUPERSENSE BIOGEL SZ 7.5 (GLOVE) ×2
GOWN STRL REUS W/ TWL LRG LVL3 (GOWN DISPOSABLE) IMPLANT
GOWN STRL REUS W/ TWL XL LVL3 (GOWN DISPOSABLE) ×2 IMPLANT
GOWN STRL REUS W/TWL LRG LVL3 (GOWN DISPOSABLE)
GOWN STRL REUS W/TWL XL LVL3 (GOWN DISPOSABLE) ×6
HANDPIECE INTERPULSE COAX TIP (DISPOSABLE)
KIT BASIN OR (CUSTOM PROCEDURE TRAY) ×3 IMPLANT
KIT ROOM TURNOVER OR (KITS) ×3 IMPLANT
LINER HUMERAL 36 +3MM SM (Shoulder) ×2 IMPLANT
MANIFOLD NEPTUNE II (INSTRUMENTS) ×3 IMPLANT
MARKER SKIN DUAL TIP RULER LAB (MISCELLANEOUS) ×2 IMPLANT
NDL 1/2 CIR CATGUT .05X1.09 (NEEDLE) ×1 IMPLANT
NDL HYPO 25GX1X1/2 BEV (NEEDLE) IMPLANT
NDL SUT 6 .5 CRC .975X.05 MAYO (NEEDLE) IMPLANT
NEEDLE 1/2 CIR CATGUT .05X1.09 (NEEDLE) ×3 IMPLANT
NEEDLE HYPO 25GX1X1/2 BEV (NEEDLE) IMPLANT
NEEDLE MAYO TAPER (NEEDLE)
NS IRRIG 1000ML POUR BTL (IV SOLUTION) ×3 IMPLANT
PACK SHOULDER (CUSTOM PROCEDURE TRAY) ×3 IMPLANT
PAD ARMBOARD 7.5X6 YLW CONV (MISCELLANEOUS) ×6 IMPLANT
PASSER SUT SWANSON 36MM LOOP (INSTRUMENTS) IMPLANT
PRESSURIZER FEMORAL UNIV (MISCELLANEOUS) IMPLANT
RESTRAINT HEAD UNIVERSAL NS (MISCELLANEOUS) ×3 IMPLANT
SCREW CENTRAL NONLOCK 25MM (Screw) ×2 IMPLANT
SCREW LOCK PERIPHERAL 30MM (Shoulder) ×2 IMPLANT
SCREW LOCK PERIPHERAL 36MM (Screw) ×2 IMPLANT
SET HNDPC FAN SPRY TIP SCT (DISPOSABLE) IMPLANT
SET PIN UNIVERSAL REVERSE (SET/KITS/TRAYS/PACK) ×2 IMPLANT
SLING ARM FOAM STRAP LRG (SOFTGOODS) IMPLANT
SPACER SHLD UNI REV 36 +6 (Shoulder) ×1 IMPLANT
SPACER TI 36/+6MM (Shoulder) ×1 IMPLANT
SPONGE LAP 18X18 X RAY DECT (DISPOSABLE) ×6 IMPLANT
SPONGE LAP 4X18 X RAY DECT (DISPOSABLE) IMPLANT
STEM REVERS UNI SZ6 CAP COATED (Shoulder) ×2 IMPLANT
SUCTION FRAZIER HANDLE 10FR (MISCELLANEOUS) ×2
SUCTION TUBE FRAZIER 10FR DISP (MISCELLANEOUS) ×1 IMPLANT
SUT BONE WAX W31G (SUTURE) IMPLANT
SUT FIBERWIRE #2 38 T-5 BLUE (SUTURE) ×6
SUT MNCRL AB 3-0 PS2 18 (SUTURE) ×3 IMPLANT
SUT MON AB 2-0 CT1 36 (SUTURE) ×3 IMPLANT
SUT VIC AB 1 CT1 27 (SUTURE) ×9
SUT VIC AB 1 CT1 27XBRD ANBCTR (SUTURE) ×1 IMPLANT
SUT VIC AB 2-0 CT1 27 (SUTURE)
SUT VIC AB 2-0 CT1 TAPERPNT 27 (SUTURE) IMPLANT
SUT VIC AB 2-0 SH 27 (SUTURE)
SUT VIC AB 2-0 SH 27X BRD (SUTURE) IMPLANT
SUTURE FIBERWR #2 38 T-5 BLUE (SUTURE) ×2 IMPLANT
SYR 30ML SLIP (SYRINGE) ×3 IMPLANT
SYR CONTROL 10ML LL (SYRINGE) IMPLANT
TOWEL OR 17X24 6PK STRL BLUE (TOWEL DISPOSABLE) ×3 IMPLANT
TOWEL OR 17X26 10 PK STRL BLUE (TOWEL DISPOSABLE) ×3 IMPLANT
TOWER CARTRIDGE SMART MIX (DISPOSABLE) IMPLANT
TRAY FOLEY CATH 16FRSI W/METER (SET/KITS/TRAYS/PACK) IMPLANT
WATER STERILE IRR 1000ML POUR (IV SOLUTION) ×3 IMPLANT

## 2016-03-16 NOTE — Anesthesia Preprocedure Evaluation (Signed)
Anesthesia Evaluation  Patient identified by MRN, date of birth, ID band Patient awake    Reviewed: Allergy & Precautions, NPO status , Patient's Chart, lab work & pertinent test results  Airway Mallampati: III  TM Distance: >3 FB Neck ROM: Full    Dental  (+) Teeth Intact, Missing   Pulmonary shortness of breath and with exertion, asthma , COPD,  COPD inhaler, Current Smoker,    breath sounds clear to auscultation       Cardiovascular hypertension, Pt. on medications (-) angina(-) Past MI and (-) CHF  Rhythm:Regular     Neuro/Psych negative neurological ROS  negative psych ROS   GI/Hepatic Neg liver ROS, GERD  Medicated and Controlled,  Endo/Other  Morbid obesity  Renal/GU negative Renal ROS     Musculoskeletal negative musculoskeletal ROS (+)   Abdominal   Peds  Hematology   Anesthesia Other Findings   Reproductive/Obstetrics                             Anesthesia Physical Anesthesia Plan  ASA: III  Anesthesia Plan: General and Regional   Post-op Pain Management: GA combined w/ Regional for post-op pain   Induction: Intravenous  Airway Management Planned: Oral ETT  Additional Equipment: None  Intra-op Plan:   Post-operative Plan: Extubation in OR  Informed Consent: I have reviewed the patients History and Physical, chart, labs and discussed the procedure including the risks, benefits and alternatives for the proposed anesthesia with the patient or authorized representative who has indicated his/her understanding and acceptance.   Dental advisory given  Plan Discussed with: CRNA and Surgeon  Anesthesia Plan Comments:         Anesthesia Quick Evaluation

## 2016-03-16 NOTE — Progress Notes (Signed)
Orthopedic pt; therefore, mupirocin not done. Betadine nasal prep done.

## 2016-03-16 NOTE — H&P (Signed)
Brenda [name]    Chief Complaint: LEFT SHOULDER PROXIMAL HUMERUS MAL-UNION HPI: The patient is a 50 y.o. female with a left proximal humeral malunion after ORIF  Past Medical History  Diagnosis Date  . Hypertension   . COPD (chronic obstructive pulmonary disease) (HCC)   . Asthma   . GERD (gastroesophageal reflux disease)   . Heart murmur     MVP- "nothing to worry about"  . Shortness of breath dyspnea     due to weight gain    Past Surgical History  Procedure Laterality Date  . Tubal ligation    . Shoulder surgery Right   . Neck surgery  2006ish    Cervical Fusion  . Ankle surgery Left   . Orif humerus fracture Left 01/06/2016    Procedure: OPEN REDUCTION INTERNAL FIXATION (ORIF) PROXIMAL HUMERUS FRACTURE;  Surgeon: Francena HanlyKevin Ambria Mayfield, MD;  Location: [facility] OR;  Service: Orthopedics;  Laterality: Left;    History reviewed. No pertinent family history.  Social History:  reports that she has been smoking.  She has never used smokeless tobacco. She reports that she drinks about 6.0 oz of alcohol per week. She reports that she uses illicit drugs (Marijuana).   Medications Prior to Admission  Medication Sig Dispense Refill  . amLODipine (NORVASC) 10 MG tablet Take 10 mg by mouth daily.    . budesonide-formoterol (SYMBICORT) 160-4.5 MCG/ACT inhaler Inhale 2 puffs into the lungs 2 (two) times daily.    . methocarbamol (ROBAXIN) 500 MG tablet Take 1 tablet (500 mg total) by mouth every 8 (eight) hours as needed for muscle spasms. 30 tablet 1  . montelukast (SINGULAIR) 10 MG tablet Take 10 mg by mouth at bedtime.    . naproxen sodium (ANAPROX) 220 MG tablet Take 660 mg by mouth as needed (for pain).    Marland Kitchen. omeprazole (PRILOSEC) 40 MG capsule Take 40 mg by mouth daily.    Marland Kitchen. oxyCODONE-acetaminophen (PERCOCET) 5-325 MG tablet Take 1-2 tablets by mouth every 4 (four) hours as needed. (Patient taking differently: Take 1 tablet by mouth every 4 (four) hours as needed for moderate pain. ) 60  tablet 0  . potassium chloride SA (K-DUR,KLOR-CON) 20 MEQ tablet Take 20 mEq by mouth 2 (two) times daily.    Marland Kitchen. tiotropium (SPIRIVA) 18 MCG inhalation capsule Place 18 mcg into inhaler and inhale daily.    . traZODone (DESYREL) 150 MG tablet Take 150 mg by mouth at bedtime.    . ondansetron (ZOFRAN) 4 MG tablet Take 1 tablet (4 mg total) by mouth every 8 (eight) hours as needed for nausea or vomiting. (Patient not taking: Reported on 03/14/2016) 20 tablet 0  . oxyCODONE-acetaminophen (PERCOCET/ROXICET) 5-325 MG tablet Take 1-2 tablets by mouth every 6 (six) hours as needed. (Patient not taking: Reported on 03/14/2016) 15 tablet 0     Physical Exam: left shoulder with painful and restricted motion as noted at recent office visits  Vitals  Resp:  [18] 18 (03/23 1248) Weight:  [90.719 kg (200 lb)] 90.719 kg (200 lb) (03/23 1248)  Assessment/Plan  Impression: LEFT SHOULDER PROXIMAL HUMERUS MAL-UNION  Plan of Action: Procedure(s): LEFT REVERSE SHOULDER ARTHROPLASTY  Duante Arocho M Criag Wicklund 03/16/2016, 2:37 PM Contact # [phone]

## 2016-03-16 NOTE — Anesthesia Procedure Notes (Addendum)
Anesthesia Regional Block:  Interscalene brachial plexus block  Pre-Anesthetic Checklist: ,, timeout performed, Correct Patient, Correct Site, Correct Laterality, Correct Procedure, Correct Position, site marked, Risks and benefits discussed,  Surgical consent,  Pre-op evaluation,  At surgeon's request and post-op pain management  Laterality: Upper and Left  Prep: chloraprep       Needles:  Injection technique: Single-shot  Needle Type: Echogenic Stimulator Needle          Additional Needles:  Procedures: ultrasound guided (picture in chart) Interscalene brachial plexus block Narrative:  Injection made incrementally with aspirations every 5 mL.  Performed by: Personally  Anesthesiologist: Val EagleMOSER, CHRISTOPHER  Additional Notes: H+P and labs reviewed, risks and benefits discussed with patient, procedure tolerated well without complications   Procedure Name: Intubation Date/Time: 03/16/2016 3:31 PM Performed by: Marena ChancyBECKNER, Fread Kottke S Pre-anesthesia Checklist: Emergency Drugs available, Patient identified, Timeout performed, Suction available and Patient being monitored Patient Re-evaluated:Patient Re-evaluated prior to inductionOxygen Delivery Method: Circle system utilized Preoxygenation: Pre-oxygenation with 100% oxygen Intubation Type: IV induction Ventilation: Mask ventilation without difficulty Laryngoscope Size: Miller and 2 Grade View: Grade I Tube type: Oral Tube size: 7.5 mm Number of attempts: 1 Placement Confirmation: ETT inserted through vocal cords under direct vision,  positive ETCO2 and breath sounds checked- equal and bilateral Tube secured with: Tape Dental Injury: Teeth and Oropharynx as per pre-operative assessment

## 2016-03-16 NOTE — Transfer of Care (Signed)
Immediate Anesthesia Transfer of Care Note  Patient: Brenda ReichertDeborah L [name]  Procedure(s) Performed: Procedure(s): LEFT REVERSE SHOULDER ARTHROPLASTY (Left) HARDWARE REMOVAL LEFT PROXIMAL HUMERUS (Left)  Patient Location: PACU  Anesthesia Type:GA combined with regional for post-op pain  Level of Consciousness: awake, alert  and oriented  Airway & Oxygen Therapy: Patient Spontanous Breathing and Patient connected to nasal cannula oxygen  Post-op Assessment: Report given to RN, Post -op Vital signs reviewed and stable and Patient moving all extremities X 4  Post vital signs: Reviewed and stable  Last Vitals:  Filed Vitals:   03/16/16 1450 03/16/16 1730  BP: 135/75 126/76  Pulse: 83 91  Temp:  36.5 C  Resp: 16 16    Complications: No apparent anesthesia complications

## 2016-03-16 NOTE — Op Note (Signed)
03/16/2016  5:11 PM  PATIENT:   Brenda [name]  50 y.o. female  PRE-OPERATIVE DIAGNOSIS:  LEFT SHOULDER PROXIMAL HUMERUS MAL-UNION  POST-OPERATIVE DIAGNOSIS:  same  PROCEDURE:  :L shoulder reverse arthroplasty #6 stem +6 insert, +3 poly 36 +4 glenosphere  SURGEON:  Makell Cyr, Vania ReaKevin M. M.D.  ASSISTANTS: Shuford pac   ANESTHESIA:   GET + ISB  EBL: 200  SPECIMEN:  none  Drains: none   PATIENT DISPOSITION:  PACU - hemodynamically stable.    PLAN OF CARE: Admit for overnight observation  Dictation# [id number]   Contact # [phone]

## 2016-03-17 ENCOUNTER — Encounter (HOSPITAL_COMMUNITY): Payer: Self-pay | Admitting: Orthopedic Surgery

## 2016-03-17 DIAGNOSIS — S42202P Unspecified fracture of upper end of left humerus, subsequent encounter for fracture with malunion: Secondary | ICD-10-CM | POA: Diagnosis not present

## 2016-03-17 MED ORDER — DIAZEPAM 5 MG PO TABS: 2.5000 mg | ORAL_TABLET | Freq: Four times a day (QID) | ORAL | Status: AC | PRN

## 2016-03-17 MED ORDER — OXYCODONE-ACETAMINOPHEN 5-325 MG PO TABS: 1.0000 | ORAL_TABLET | ORAL | Status: AC | PRN

## 2016-03-17 MED ORDER — NAPROXEN 500 MG PO TABS: 500.0000 mg | ORAL_TABLET | Freq: Two times a day (BID) | ORAL | Status: AC

## 2016-03-17 MED ORDER — HYDROMORPHONE HCL 2 MG PO TABS: 2.0000 mg | ORAL_TABLET | ORAL | Status: AC | PRN

## 2016-03-17 NOTE — Op Note (Signed)
Brenda [name]           ACCOUNT NO.:  [id number]  MEDICAL RECORD NO.:  [id number]  LOCATION:  5N22C                        FACILITY:  [facility]  PHYSICIAN:  Vania Rea. Graciella Arment, M.D.  DATE OF BIRTH:  [birth year]  DATE OF PROCEDURE:  03/16/2016 DATE OF DISCHARGE:                              OPERATIVE REPORT   PREOPERATIVE DIAGNOSIS:  Left proximal humerus malunion/nonunion.  POSTOPERATIVE DIAGNOSES: 1. Left proximal humerus malunion/nonunion. 2. Hardware removal from the left proximal humerus.  PROCEDURE:  Left shoulder reverse arthroplasty utilizing a press-fit size 6 Arthrex stem with a +6 insert, +3 poly, and 36+4 glenosphere.  SURGEON:  Vania Rea. Bina Veenstra, M.D.  Threasa HeadsFrench Ana A. Shuford, P.A.-C.  ANESTHESIA:  General endotracheal as well as an interscalene block.  ESTIMATED BLOOD LOSS:  200 mL.  DRAINS:  None.  BRIEF HISTORY:  Brenda [name] is a 50 year old female, who previously sustained a comminuted left 3-part proximal humerus fracture, for which I had performed an open reduction and internal fixation over 3 months ago.  Postoperatively, she has unfortunately continued to have difficulties with pain.  Her radiographs have showed a progressive collapse of the articular surface of the humeral head as well as displacement of the greater tuberosity.  Due to her increasing deformity, severe pain, loss of mobility, and displacement of the tuberosity fragments, it is felt that she would benefit from conversion to a reverse shoulder arthroplasty.  She was preoperatively counseled regarding treatment options, potential risks versus benefits thereof.  Possible surgical complications were reviewed including bleeding, infection, vascular injury, persistent pain, loss of motion, anesthetic complication, failure of the implant, and possible need for additional surgery.  She understands and accepts and agrees with our planned procedure.  PROCEDURE IN DETAIL:  After  undergoing routine preop evaluation, the patient received prophylactic antibiotics.  An interscalene block was established under with the Anesthesia Department in the left upper extremity.  Brought to the operating room, placed supine on the operating table.  Underwent smooth induction of a general endotracheal anesthesia.  Placed in the beach chair position and appropriately padded and protected.  The left shoulder girdle region was sterilely prepped and draped in standard fashion.  Time-out was called.  An anterior deltopectoral approach to the left shoulder was made through a previous incision that was approximately 10 cm in length.  Skin flaps were carefully elevated and mobilized. Carried dissection deeply and there was significant scarring and the cephalic vein was markedly tortuous, and ultimately had to be ligated to gain appropriate access.  We did then go ahead and develop the interval from proximal to distal and used electrocautery to obtain hemostasis from a number of the adhesions that we had to divide.  Ultimately mobilized beneath the deltoid.  It was retracted laterally.  The conjoint tendon was identified, mobilized, and retracted medially.  We then carefully removed the soft tissue which had overgrown the previously placed proximal humeral plate and dissected this completely such that we could remove the plate, and its screws and pegs without difficulty.  Once this was completed, we saw that the articular segment had not healed, and there was gross motion including nonunion evidence for some impending avascular necrosis.  In addition, tuberosities  were very loose as well.  We went ahead and excised the remnant of the lesser tuberosity and then removed the subscapularis as a sleeve, which extended distally towards the metaphysis of the humerus and then dissected around the humeral neck to gain access and delivery of the proximal humerus through the wound.  The  remnant of the greater tuberosity as well was debrided with a rongeur and the musculature was very fibrotic and noncontractile.  We excised the superior portion of the rotator cuff, gaining good visualization of the glenoid.  The articular surface was removed as a single fragment, and then we removed all the residual bony debris, and this gave this a excellent visualization of the glenoid.  We performed a circumferential labral resection and removed the proximal stump of the long head of the biceps.  We placed a guide pin into the center of the glenoid.  This was then reamed placing our central reaming hole, and then we reamed the periphery of the glenoid such that we had excellent bony contour and good bone exposure.  We then seated our small glenoid base plate.  It was transfixed with a central lag screw.  Inferior and superior locking screws, all of which obtained excellent bony purchase and fixation.  We then placed our 36+4 glenosphere of the base plate. This was impacted and achieved good stability and fixation.  At this point, we returned our attention to the proximal humeral metaphysis, where we broached at approximately 20 degrees retroversion up to size 6, which ultimately obtained proper fit and fixation.  We performed some trial reductions of this, and we were satisfied with the soft tissue balance.  We then assembled our final size 6 stem with a 135 base plate.  This was then impacted in position, which achieved excellent fixation.  We then performed a series of trial reductions, and ultimately felt that a total of +9 length was appropriate, so we utilized a +6 spacer and a +3 poly and we were very pleased with our soft tissue balance.  The final construct was assembled, appropriately impacted, and final reduction was performed.  The shoulder showed excellent mobility and good stability.  The wound was copiously irrigated.  Hemostasis obtained.  The deltopectoral interval  was then reapproximated with a series of figure-of-eight #1 Vicryl sutures, 2-0 Vicryl used subcu layer, intracuticular 3-0 Monocryl for the skin followed by Dermabond.  An Aquacel dressing.  Left arm was placed in a sling.  The patient was then awakened, extubated, and taken recovery room in stable condition.  Lucita Loraracy A Shuford, PA-C was used as an Geophysicist/field seismologistassistant throughout this case, was essential for help with positioning the patient, positioning the extremity, tissue manipulation, retraction implantation of prosthesis, wound closure, and intraoperative decision making.     Vania ReaKevin M. Makalah Asberry, M.D.     KMS/MEDQ  D:  03/16/2016  T:  03/16/2016  Job:  [id number]

## 2016-03-17 NOTE — Evaluation (Signed)
Occupational Therapy Evaluation Patient Details Name: AJAYLA IGLESIAS MRN: [id number] DOB: 10-Nov-1966 Today's Date: 03/17/2016    History of Present Illness pt was admitted for L reverse TSA due to non union of fx after ORIF.  Pt has had R shoulder sx, and also has HTN and COPD   Clinical Impression   This 50 year old female was admitted for the above. All education completed. Pt will follow up with Dr Rennis Chris for further rehab   Follow Up Recommendations  Supervision/Assistance - 24 hour    Equipment Recommendations  None recommended by OT    Recommendations for Other Services       Precautions / Restrictions Precautions Precautions: Shoulder Type of Shoulder Precautions: sling on for sleep and during day except for adls/exercises.  Pt can remove sling if sitting in controlled environment .  A/PROM FF to 90 and ABD to 60; no internal rotation exercises/resisted internal rotation Precaution Comments: handout issued Restrictions Weight Bearing Restrictions: Yes Other Position/Activity Restrictions: NWB      Mobility Bed Mobility Overal bed mobility: Needs Assistance             General bed mobility comments: minA for trunk  Transfers Overall transfer level: Independent                    Balance                                            ADL Overall ADL's : Needs assistance/impaired     Grooming: Wash/dry face;Standing;Independent   Upper Body Bathing: Moderate assistance;Sitting   Lower Body Bathing: Moderate assistance;Sit to/from stand   Upper Body Dressing : Maximal assistance;Sitting (large sweatshirt over sling)   Lower Body Dressing: Maximal assistance;Sit to/from stand   Toilet Transfer: Independent;Ambulation   Toileting- Clothing Manipulation and Hygiene: Moderate assistance;Sit to/from stand         General ADL Comments: performed all shoulder education, and issued handout.  Reviewed exercises and pt  performed FF to 45 and abduction to 45 supine.  Husband verbalizes understanding of donning sling.  Performed ADL.     Vision     Perception     Praxis      Pertinent Vitals/Pain Pain Assessment: 0-10 Pain Score: 6  Pain Location: L arm; still has numbness but also with pain Pain Descriptors / Indicators: Sore;Numbness Pain Intervention(s): Limited activity within patient's tolerance;Monitored during session;Premedicated before session;Repositioned;Ice applied     Hand Dominance Right   Extremity/Trunk Assessment Upper Extremity Assessment Upper Extremity Assessment: LUE deficits/detail LUE Deficits / Details: immobilized; still has numbness from block.  Able to wiggle fingers           Communication Communication Communication: No difficulties   Cognition Arousal/Alertness: Awake/alert Behavior During Therapy: WFL for tasks assessed/performed Overall Cognitive Status: Within Functional Limits for tasks assessed                     General Comments       Exercises       Shoulder Instructions      Home Living Family/patient expects to be discharged to:: Private residence Living Arrangements: Spouse/significant other Available Help at Discharge: Friend(s);Family                             Additional Comments:  pt does not have DME and doesn't feel she needs it.  Has tub and high commode      Prior Functioning/Environment Level of Independence: Independent             OT Diagnosis: Acute pain   OT Problem List:     OT Treatment/Interventions:      OT Goals(Current goals can be found in the care plan section) Acute Rehab OT Goals Patient Stated Goal: get shoulder healed and back to being independent  OT Frequency:     Barriers to D/C:            Co-evaluation              End of Session    Activity Tolerance: Patient tolerated treatment well Patient left: in bed;with call bell/phone within reach;with  family/visitor present   Time: [id number] OT Time Calculation (min): 33 min Charges:  OT General Charges $OT Visit: 1 Procedure OT Evaluation $OT Eval Moderate Complexity: 1 Procedure OT Treatments $Self Care/Home Management : 8-22 mins G-Codes: OT G-codes **NOT FOR INPATIENT CLASS** Functional Assessment Tool Used: Clinical observation Functional Limitation: Self care Self Care Current Status ([id number]([id number]): At least 60 percent but less than 80 percent impaired, limited or restricted Self Care Goal Status ([id number](G8988): At least 60 percent but less than 80 percent impaired, limited or restricted Self Care Discharge Status [phone](G8989): At least 60 percent but less than 80 percent impaired, limited or restricted  [facility] WomenENCER,Terrence Wishon 03/17/2016, 11:21 AM  Marica OtterMaryellen Mackenize Delgadillo, OTR/L [phone] 03/17/2016

## 2016-03-17 NOTE — Discharge Summary (Signed)
PATIENT ID:      Brenda [name]  MRN:     [id number] DOB/AGE:    07/19/1966 / 50 y.o.     DISCHARGE SUMMARY  ADMISSION DATE:    03/16/2016 DISCHARGE DATE:    ADMISSION DIAGNOSIS: LEFT SHOULDER PROXIMAL HUMERUS MAL-UNION Past Medical History  Diagnosis Date  . Hypertension   . COPD (chronic obstructive pulmonary disease) (HCC)   . Asthma   . GERD (gastroesophageal reflux disease)   . Heart murmur     MVP- "nothing to worry about"  . Shortness of breath dyspnea     due to weight gain    DISCHARGE DIAGNOSIS:   Active Problems:   Proximal humeral fracture   PROCEDURE: Procedure(s): LEFT REVERSE SHOULDER ARTHROPLASTY HARDWARE REMOVAL LEFT PROXIMAL HUMERUS on 03/16/2016  CONSULTS:     HISTORY:  See H&P in chart.  HOSPITAL COURSE:  Brenda [name] is a 50 y.o. admitted on 03/16/2016 with a diagnosis of LEFT SHOULDER PROXIMAL HUMERUS MAL-UNION.  They were brought to the operating room on 03/16/2016 and underwent Procedure(s): LEFT REVERSE SHOULDER ARTHROPLASTY HARDWARE REMOVAL LEFT PROXIMAL HUMERUS.    They were given perioperative antibiotics: Anti-infectives    Start     Dose/Rate Route Frequency Ordered Stop   03/16/16 2100  ceFAZolin (ANCEF) IVPB 2g/100 mL premix     2 g over 30 Minutes Intravenous Every 6 hours 03/16/16 1849 03/17/16 0905   03/16/16 0600  ceFAZolin (ANCEF) IVPB 2g/100 mL premix     2 g over 30 Minutes Intravenous To [facility] 03/15/16 1349 03/16/16 1524    .  Patient underwent the above named procedure and tolerated it well. The following day they were hemodynamically stable and pain was controlled on oral analgesics. They were neurovascularly intact to the operative extremity. OT was ordered and worked with patient per protocol. They were medically and orthopaedically stable for discharge on day 1.   DIAGNOSTIC STUDIES:  RECENT RADIOGRAPHIC STUDIES :  No results found.  RECENT VITAL SIGNS:  Patient Vitals for the past 24 hrs:  BP  Temp Temp src Pulse Resp SpO2 Weight  03/17/16 0812 - - - - - 94 % -  03/17/16 0503 114/64 mmHg 98.9 F (37.2 C) Oral 80 16 93 % -  03/17/16 0123 (!) 98/57 mmHg 98.7 F (37.1 C) Oral 81 16 94 % -  03/16/16 2019 104/64 mmHg 97.8 F (36.6 C) Oral 85 18 96 % -  03/16/16 1846 110/70 mmHg 97.8 F (36.6 C) - 77 18 96 % -  03/16/16 1830 109/77 mmHg 97.7 F (36.5 C) - - - - -  03/16/16 1800 115/75 mmHg - - - - - -  03/16/16 1745 119/83 mmHg - - - - - -  03/16/16 1730 126/76 mmHg 97.7 F (36.5 C) - 91 16 97 % -  03/16/16 1450 135/75 mmHg - - 83 16 100 % -  03/16/16 1445 (!) 167/98 mmHg - - 88 (!) 23 100 % -  03/16/16 1248 - - - - 18 - 90.719 kg (200 lb)  .  RECENT EKG RESULTS:    Orders placed or performed during the hospital encounter of 03/16/16  . EKG 12 lead  . EKG 12 lead    DISCHARGE INSTRUCTIONS:  Discharge Instructions    Discontinue IV    Complete by:  As directed            DISCHARGE MEDICATIONS:     Medication List    TAKE these medications  amLODipine 10 MG tablet  Commonly known as:  NORVASC  Take 10 mg by mouth daily.     budesonide-formoterol 160-4.5 MCG/ACT inhaler  Commonly known as:  SYMBICORT  Inhale 2 puffs into the lungs 2 (two) times daily.     diazepam 5 MG tablet  Commonly known as:  VALIUM  Take 0.5-1 tablets (2.5-5 mg total) by mouth every 6 (six) hours as needed for muscle spasms or sedation.     HYDROmorphone 2 MG tablet  Commonly known as:  DILAUDID  Take 1-2 tablets (2-4 mg total) by mouth every 4 (four) hours as needed for severe pain.     methocarbamol 500 MG tablet  Commonly known as:  ROBAXIN  Take 1 tablet (500 mg total) by mouth every 8 (eight) hours as needed for muscle spasms.     montelukast 10 MG tablet  Commonly known as:  SINGULAIR  Take 10 mg by mouth at bedtime.     naproxen 500 MG tablet  Commonly known as:  NAPROSYN  Take 1 tablet (500 mg total) by mouth 2 (two) times daily with a meal.     naproxen  sodium 220 MG tablet  Commonly known as:  ANAPROX  Take 660 mg by mouth as needed (for pain).     omeprazole 40 MG capsule  Commonly known as:  PRILOSEC  Take 40 mg by mouth daily.     ondansetron 4 MG tablet  Commonly known as:  ZOFRAN  Take 1 tablet (4 mg total) by mouth every 8 (eight) hours as needed for nausea or vomiting.     oxyCODONE-acetaminophen 5-325 MG tablet  Commonly known as:  PERCOCET  Take 1-2 tablets by mouth every 4 (four) hours as needed.     potassium chloride SA 20 MEQ tablet  Commonly known as:  K-DUR,KLOR-CON  Take 20 mEq by mouth 2 (two) times daily.     tiotropium 18 MCG inhalation capsule  Commonly known as:  SPIRIVA  Place 18 mcg into inhaler and inhale daily.     traZODone 150 MG tablet  Commonly known as:  DESYREL  Take 150 mg by mouth at bedtime.        FOLLOW UP VISIT:       Follow-up Information    Follow up with Vania Rea SUPPLE, MD.   Specialty:  Orthopedic Surgery   Why:  call to be seen in 10-14 days   Contact information:   [address] [place] [address] [phone]       DISCHARGE TO: Home  DISPOSITION: Good  DISCHARGE CONDITION:  Rodolph Bong for Dr. Francena Hanly 03/17/2016, 9:14 AM

## 2016-03-17 NOTE — Discharge Instructions (Signed)

## 2016-03-20 NOTE — Anesthesia Postprocedure Evaluation (Signed)
Anesthesia Post Note  Patient: Carlyn ReichertDeborah L Bourbeau  Procedure(s) Performed: Procedure(s) (LRB): LEFT REVERSE SHOULDER ARTHROPLASTY (Left) HARDWARE REMOVAL LEFT PROXIMAL HUMERUS (Left)  Patient location during evaluation: PACU Anesthesia Type: General and Regional Level of consciousness: awake and alert Pain management: pain level controlled Vital Signs Assessment: post-procedure vital signs reviewed and stable Respiratory status: spontaneous breathing, nonlabored ventilation, respiratory function stable and patient connected to nasal cannula oxygen Cardiovascular status: blood pressure returned to baseline and stable Postop Assessment: no signs of nausea or vomiting Anesthetic complications: no    Last Vitals:  Filed Vitals:   03/17/16 0123 03/17/16 0503  BP: 98/57 114/64  Pulse: 81 80  Temp: 37.1 C 37.2 C  Resp: 16 16    Last Pain:  Filed Vitals:   03/17/16 1236  PainSc: 0-No pain                 Kennieth RadFitzgerald, Taysen Bushart E

## 2016-03-24 ENCOUNTER — Encounter (HOSPITAL_COMMUNITY): Payer: Self-pay | Admitting: Orthopedic Surgery

## 2016-09-25 IMAGING — RF DG HUMERUS 2V *L*
1 series · 5 of 5 positions shown · non-contrast
Comparison: None

FLUOROSCOPY TIME:  1 minutes 7 seconds

CLINICAL DATA: ORIF proximal humerus fracture

EXAM:
LEFT HUMERUS - 2+ VIEW; DG C-ARM 61-120 MIN

[Series 1: run · 5 of 5 slices shown]
[im 1/5]
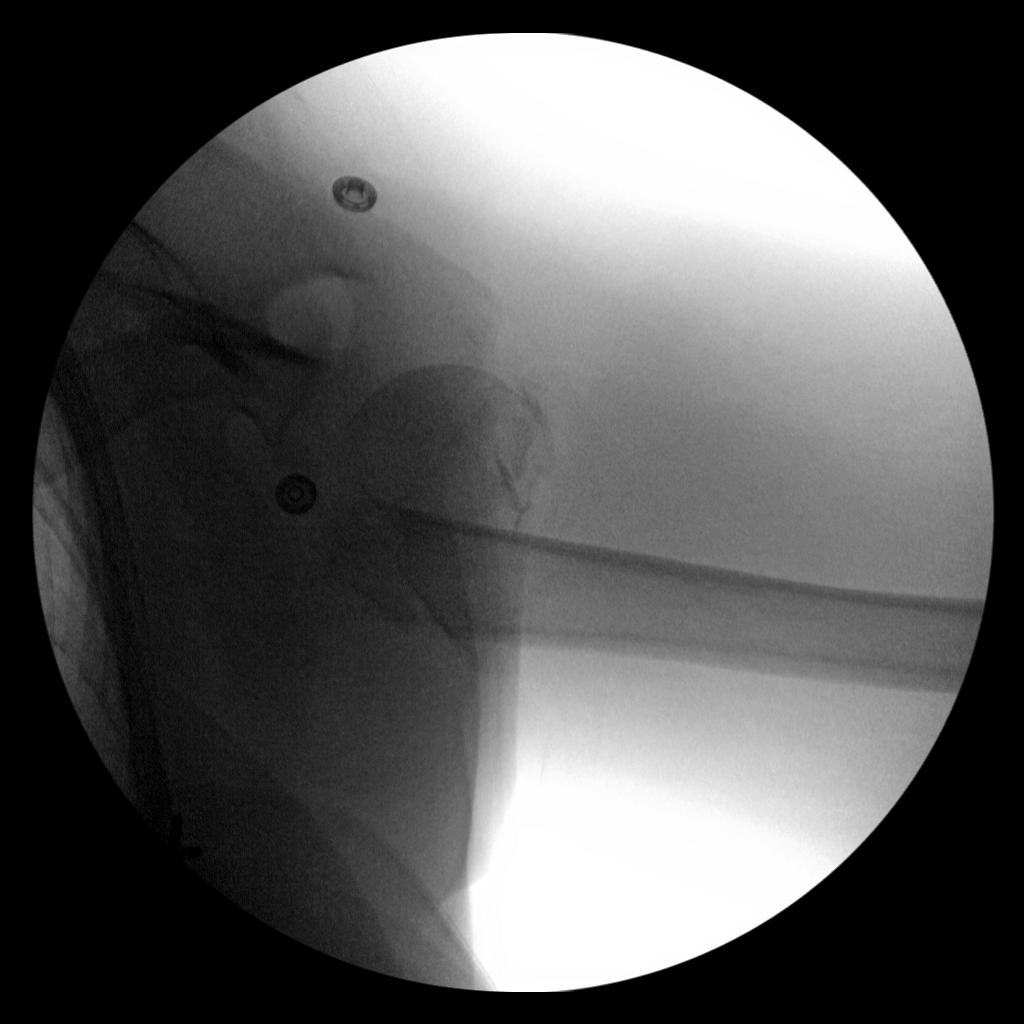
[im 2/5]
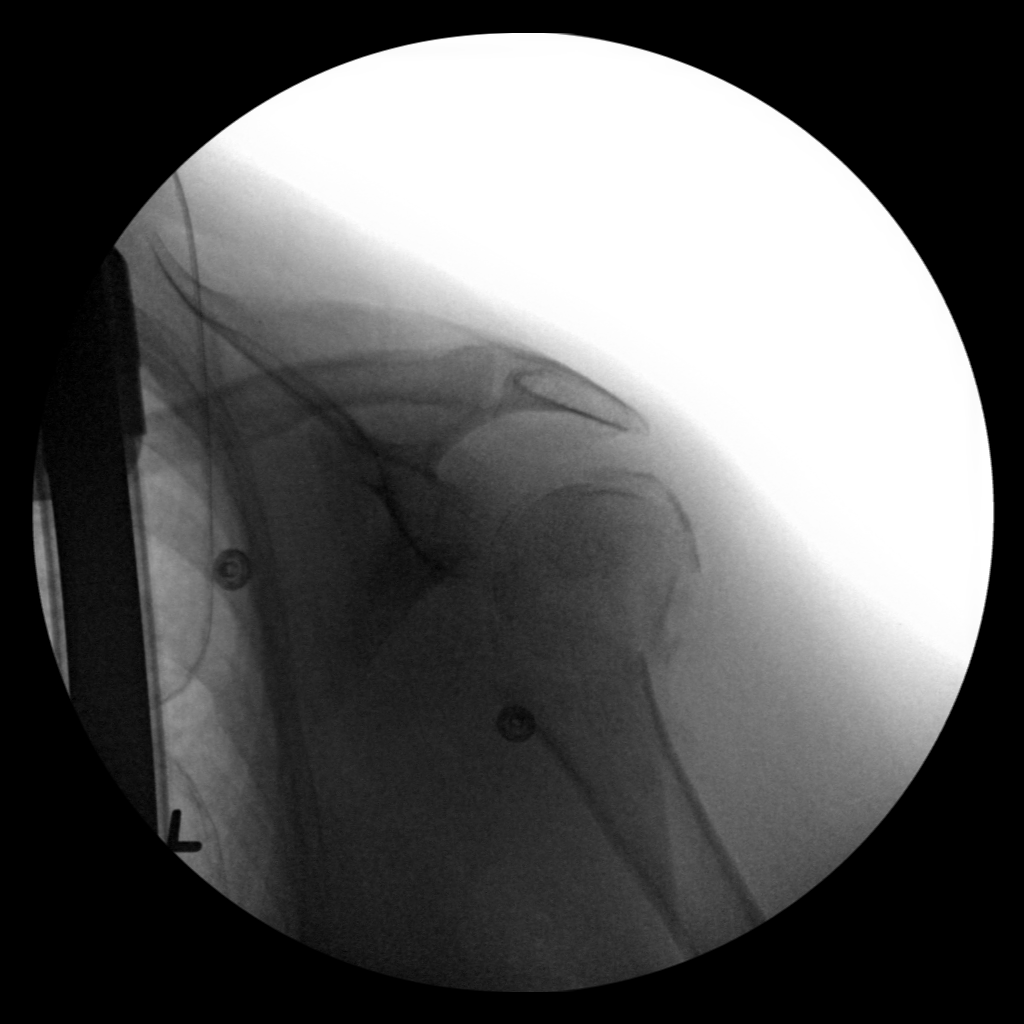
[im 3/5]
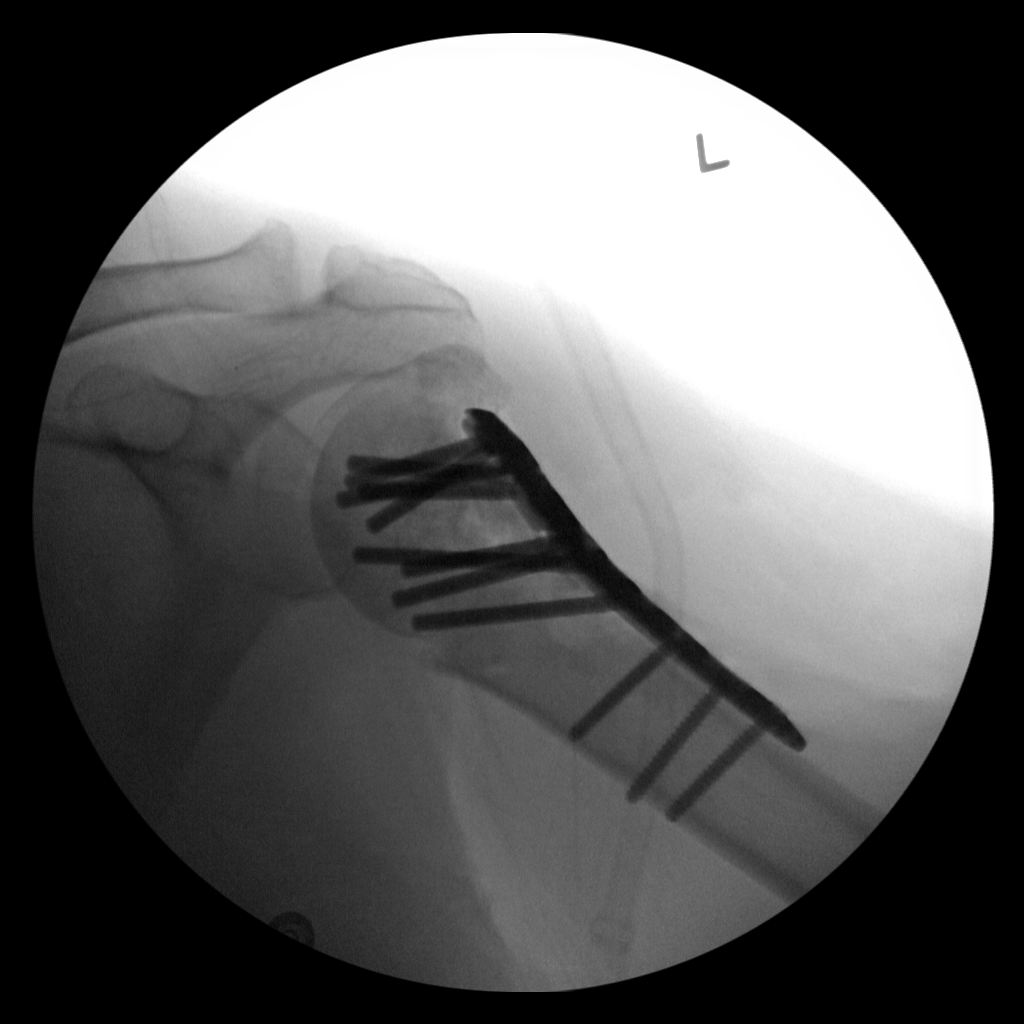
[im 4/5]
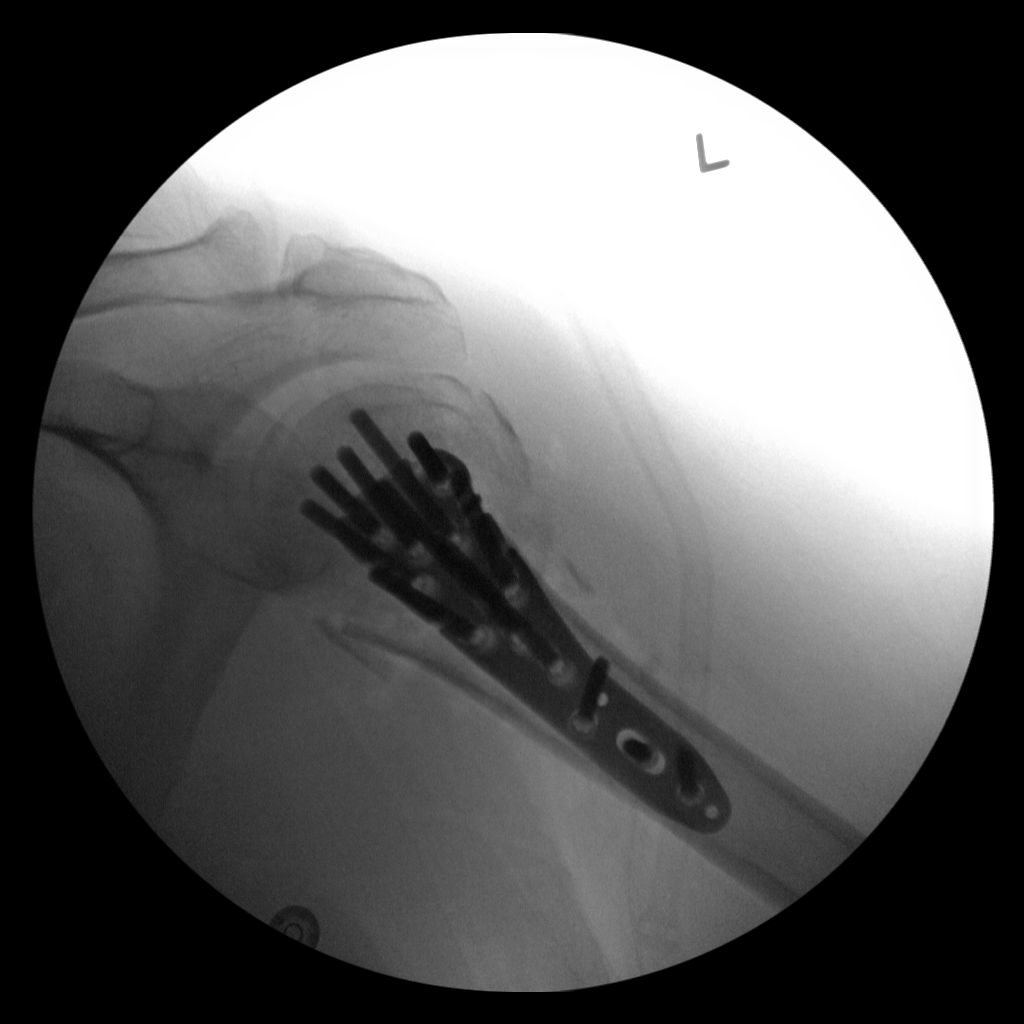
[im 5/5]
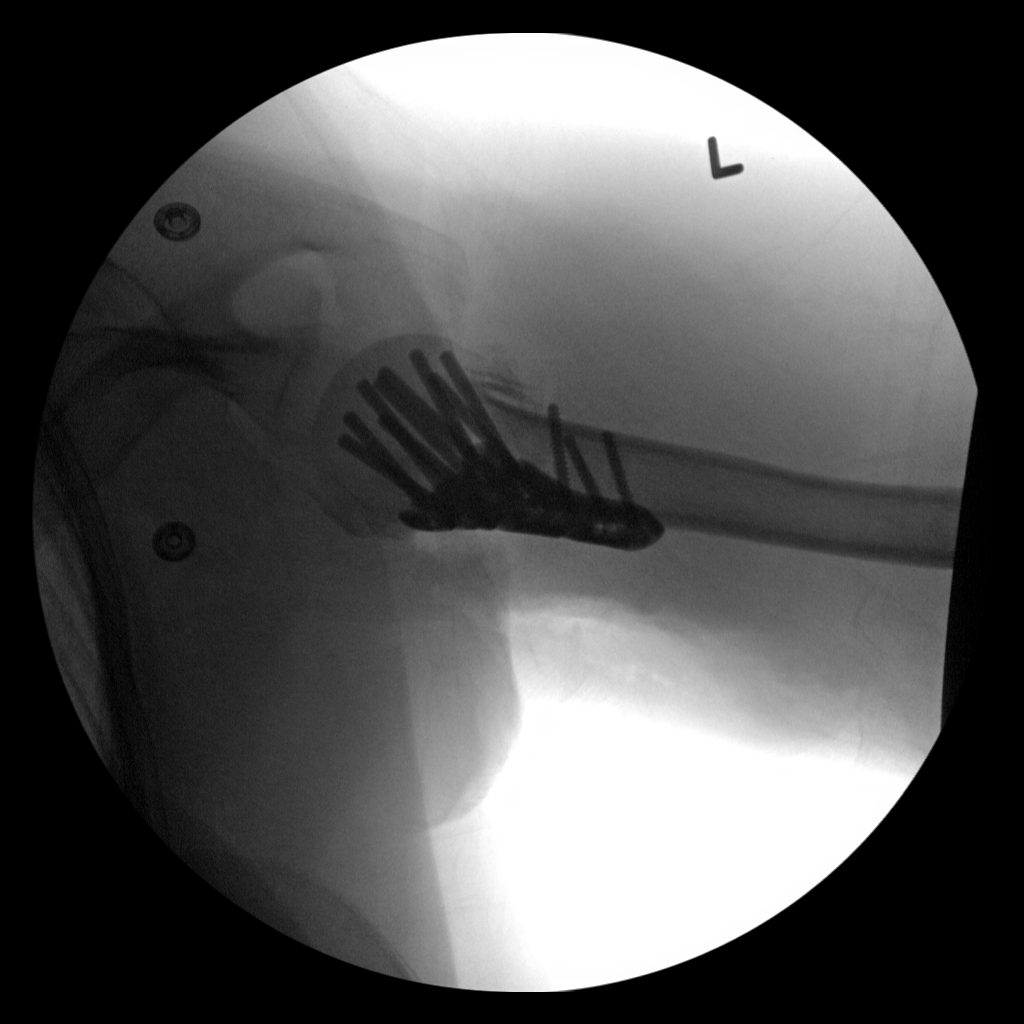

[5 of 5 positions shown; findings below may reference images not displayed]

FINDINGS: Comminuted fracture of the surgical neck of the left proximal
humerus transfixed with a lateral metallic sideplate and multiple
interlocking screws without failure complication. Alignment is near
anatomic. No dislocation.
IMPRESSION: ORIF proximal left humeral fracture.

## 2025-01-26 ENCOUNTER — Emergency Department

## 2025-01-26 ENCOUNTER — Other Ambulatory Visit: Payer: Self-pay

## 2025-01-26 ENCOUNTER — Encounter: Payer: Self-pay | Admitting: Emergency Medicine

## 2025-01-26 ENCOUNTER — Emergency Department: Admission: EM | Admit: 2025-01-26 | Discharge: 2025-01-26 | Disposition: A | Discharge status: Home / Self Care

## 2025-01-26 DIAGNOSIS — J441 Chronic obstructive pulmonary disease with (acute) exacerbation: Secondary | ICD-10-CM | POA: Insufficient documentation

## 2025-01-26 DIAGNOSIS — I509 Heart failure, unspecified: Secondary | ICD-10-CM | POA: Insufficient documentation

## 2025-01-26 HISTORY — DX: Unspecified cirrhosis of liver: K74.60

## 2025-01-26 LAB — CBC
HCT: 37.3 % (ref 36.0–46.0)
Hemoglobin: 11.8 g/dL — ABNORMAL LOW (ref 12.0–15.0)
MCH: 30.5 pg (ref 26.0–34.0)
MCHC: 31.6 g/dL (ref 30.0–36.0)
MCV: 96.4 fL (ref 80.0–100.0)
Platelets: 250 10*3/uL (ref 150–400)
RBC: 3.87 MIL/uL (ref 3.87–5.11)
RDW: 14.1 % (ref 11.5–15.5)
WBC: 10.8 10*3/uL — ABNORMAL HIGH (ref 4.0–10.5)
nRBC: 0 % (ref 0.0–0.2)

## 2025-01-26 LAB — BASIC METABOLIC PANEL WITH GFR
Anion gap: 11 (ref 5–15)
BUN: 8 mg/dL (ref 6–20)
CO2: 25 mmol/L (ref 22–32)
Calcium: 8.7 mg/dL — ABNORMAL LOW (ref 8.9–10.3)
Chloride: 105 mmol/L (ref 98–111)
Creatinine, Ser: 0.88 mg/dL (ref 0.44–1.00)
GFR, Estimated: >60 mL/min
Glucose, Bld: 111 mg/dL — ABNORMAL HIGH (ref 70–99)
Potassium: 4.1 mmol/L (ref 3.5–5.1)
Sodium: 142 mmol/L (ref 135–145)

## 2025-01-26 LAB — PRO BRAIN NATRIURETIC PEPTIDE: Pro Brain Natriuretic Peptide: 1395 pg/mL — ABNORMAL HIGH

## 2025-01-26 LAB — TROPONIN T, HIGH SENSITIVITY: Troponin T High Sensitivity: 20 ng/L — ABNORMAL HIGH (ref 0–19)

## 2025-01-26 MED ORDER — FUROSEMIDE 10 MG/ML IJ SOLN
20.0000 mg | Freq: Once | INTRAMUSCULAR | Status: AC
  Administered 2025-01-26: 20 mg via INTRAVENOUS
  Filled 2025-01-26: qty 4

## 2025-01-26 MED ORDER — METHYLPREDNISOLONE SODIUM SUCC 125 MG IJ SOLR
125.0000 mg | Freq: Once | INTRAMUSCULAR | Status: AC
  Administered 2025-01-26: 125 mg via INTRAVENOUS
  Filled 2025-01-26: qty 2

## 2025-01-26 MED ORDER — IPRATROPIUM-ALBUTEROL 0.5-2.5 (3) MG/3ML IN SOLN
3.0000 mL | Freq: Once | RESPIRATORY_TRACT | Status: AC
  Administered 2025-01-26: 3 mL via RESPIRATORY_TRACT
  Filled 2025-01-26: qty 3

## 2025-01-26 MED ORDER — PREDNISONE 10 MG PO TABS: ORAL_TABLET | ORAL | 0 refills | Status: AC

## 2025-01-26 MED ORDER — OXYCODONE-ACETAMINOPHEN 7.5-325 MG PO TABS
1.0000 | ORAL_TABLET | Freq: Once | ORAL | Status: AC
  Administered 2025-01-26: 1 via ORAL
  Filled 2025-01-26: qty 1
